# Patient Record
Sex: Male | Born: 1980 | Race: White | Hispanic: No | Marital: Married | Smoking: Never smoker
Health system: Southern US, Community
[De-identification: ages and names within clinical notes are randomized; demographics above are authoritative.]

## PROBLEM LIST (undated history)

## (undated) DIAGNOSIS — R030 Elevated blood-pressure reading, without diagnosis of hypertension: Secondary | ICD-10-CM

## (undated) DIAGNOSIS — E7800 Pure hypercholesterolemia, unspecified: Secondary | ICD-10-CM

## (undated) HISTORY — DX: Elevated blood-pressure reading, without diagnosis of hypertension: R03.0

## (undated) HISTORY — PX: NEVUS EXCISION: SHX2090

## (undated) HISTORY — DX: Pure hypercholesterolemia, unspecified: E78.00

---
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
## 2012-06-18 ENCOUNTER — Ambulatory Visit (INDEPENDENT_AMBULATORY_CARE_PROVIDER_SITE_OTHER): Payer: Managed Care, Other (non HMO) | Admitting: Family Medicine

## 2012-06-18 VITALS — BP 139/73 | HR 71 | Temp 97.9°F | Resp 16 | Ht 72.0 in | Wt 206.0 lb

## 2012-06-18 DIAGNOSIS — L738 Other specified follicular disorders: Secondary | ICD-10-CM

## 2012-06-18 DIAGNOSIS — R635 Abnormal weight gain: Secondary | ICD-10-CM

## 2012-06-18 DIAGNOSIS — L853 Xerosis cutis: Secondary | ICD-10-CM

## 2012-06-18 DIAGNOSIS — Z0000 Encounter for general adult medical examination without abnormal findings: Secondary | ICD-10-CM

## 2012-06-18 LAB — COMPREHENSIVE METABOLIC PANEL
ALT: 34 U/L (ref 0–53)
AST: 22 U/L (ref 0–37)
Albumin: 4.9 g/dL (ref 3.5–5.2)
Alkaline Phosphatase: 57 U/L (ref 39–117)
BUN: 12 mg/dL (ref 6–23)
CO2: 27 mEq/L (ref 19–32)
Calcium: 10.3 mg/dL (ref 8.4–10.5)
Chloride: 100 mEq/L (ref 96–112)
Creat: 1.06 mg/dL (ref 0.50–1.35)
Glucose, Bld: 82 mg/dL (ref 70–99)
Potassium: 4.1 mEq/L (ref 3.5–5.3)
Sodium: 137 mEq/L (ref 135–145)
Total Bilirubin: 0.6 mg/dL (ref 0.3–1.2)
Total Protein: 8.3 g/dL (ref 6.0–8.3)

## 2012-06-18 LAB — POCT CBC
Granulocyte percent: 62 %G (ref 37–80)
HCT, POC: 51.3 % (ref 43.5–53.7)
Hemoglobin: 17.2 g/dL (ref 14.1–18.1)
Lymph, poc: 2.3 (ref 0.6–3.4)
MCH, POC: 29.9 pg (ref 27–31.2)
MCHC: 33.5 g/dL (ref 31.8–35.4)
MCV: 89.1 fL (ref 80–97)
MID (cbc): 0.4 (ref 0–0.9)
MPV: 8.7 fL (ref 0–99.8)
POC Granulocyte: 4.5 (ref 2–6.9)
POC LYMPH PERCENT: 31.9 %L (ref 10–50)
POC MID %: 6.1 %M (ref 0–12)
Platelet Count, POC: 333 10*3/uL (ref 142–424)
RBC: 5.76 M/uL (ref 4.69–6.13)
RDW, POC: 13.6 %
WBC: 7.2 10*3/uL (ref 4.6–10.2)

## 2012-06-18 LAB — LIPID PANEL
Cholesterol: 210 mg/dL — ABNORMAL HIGH (ref 0–200)
HDL: 49 mg/dL (ref >39)
LDL Cholesterol: 139 mg/dL — ABNORMAL HIGH (ref 0–99)
Total CHOL/HDL Ratio: 4.3 Ratio
Triglycerides: 112 mg/dL (ref <150)
VLDL: 22 mg/dL (ref 0–40)

## 2012-06-18 LAB — TSH: TSH: 2.457 u[IU]/mL (ref 0.350–4.500)

## 2012-06-18 NOTE — Progress Notes (Signed)
Subjective:    Patient ID: Joe Alexander, male    DOB: [birth year], 33 y.o.   MRN: [id number]  HPI Joe Alexander is a 32 y.o. male  Here for physical for insurance.  No recent primary care provider.  No medical problems, no meds. No specific concerns today.  Fasting since midnight last night.   Off work past few years - has 33yo and 33yo dtr's at home, 9yo and 10yo sons. 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  Planning on law school.   Wife works at [employer] - sonographer.   Nonsmoker. Less exercise since staying home with the kids.   Dry skin for years, weight gain of about 15 pounds past few years - attributes to less activity.   FH: father with cad, cvd, diabetes, stroke.  First heart sx's in late 35's or early 58's.  2 brothers with CAD/MI - 43yo (fatal), and other brother with MI at 36yo - both were smokers.  Colon cancer - father - twice, earliest in mid 11's.       Review of Systems  Constitutional: Positive for unexpected weight change (15 pounds past few years. ). Negative for chills.  Endocrine: Negative for cold intolerance and heat intolerance.  All other systems reviewed and are negative.       Objective:   Physical Exam  Vitals reviewed. Constitutional: He is oriented to person, place, and time. He appears well-developed and well-nourished.  HENT:  Head: Normocephalic and atraumatic.  Right Ear: External ear normal.  Left Ear: External ear normal.  Mouth/Throat: Oropharynx is clear and moist.  Eyes: Conjunctivae and EOM are normal. Pupils are equal, round, and reactive to light.  Neck: Normal range of motion. Neck supple. No thyromegaly present.  Cardiovascular: Normal rate, regular rhythm, normal heart sounds and intact distal pulses.   Pulmonary/Chest: Effort normal and breath sounds normal. No respiratory distress. He has no wheezes.  Abdominal: Soft. He exhibits no distension. There is no tenderness.  Hernia confirmed negative in the right inguinal area and confirmed negative in the left inguinal area.  Musculoskeletal: Normal range of motion. He exhibits no edema and no tenderness.  Lymphadenopathy:    He has no cervical adenopathy.  Neurological: He is alert and oriented to person, place, and time. He has normal reflexes.  Skin: Skin is warm and dry.  Psychiatric: He has a normal mood and affect. His behavior is normal.    Results for orders placed in visit on 06/18/12  POCT CBC      Result Value Range   WBC 7.2  4.6 - 10.2 K/uL   Lymph, poc 2.3  0.6 - 3.4   POC LYMPH PERCENT 31.9  10 - 50 %L   MID (cbc) 0.4  0 - 0.9   POC MID % 6.1  0 - 12 %M   POC Granulocyte 4.5  2 - 6.9   Granulocyte percent 62.0  37 - 80 %G   RBC 5.76  4.69 - 6.13 M/uL   Hemoglobin 17.2  14.1 - 18.1 g/dL   HCT, POC 21.3  08.6 - 53.7 %   MCV 89.1  80 - 97 fL   MCH, POC 29.9  27 - 31.2 pg   MCHC 33.5  31.8 - 35.4 g/dL   RDW, POC 57.8     Platelet Count, POC 333  142 - 424 K/uL   MPV 8.7  0 - 99.8 fL  Assessment & Plan:  Joe Alexander is a 32 y.o. male Routine general medical examination at a health care facility - Plan: POCT CBC, TSH, Comprehensive metabolic panel, Lipid panel. Anticipatory guidance. rtc precautions. Form partially completed until labs.   Weight gain - Plan: TSH, Comprehensive metabolic panel.  Discussed exercise as likely cause.   Dry skin - Plan: TSH.  Discussed hydrating soap, lotion as needed, moisturizing shampoo few dry flakes in scalp on exam).  Patient Instructions  Check into your prior doctor's records.  If more than 10 years since last tetanus - needs updating.  Your should receive a call or letter about your lab results within the next week to 10 days, and you can bring your form to be signed at that point.  We will check cholesterol, blood sugar and other tests discussed. Plan on discussing risks of heart disease over the next few years given your early  family history.    Keeping you healthy  Get these tests  Blood pressure- Have your blood pressure checked once a year by your healthcare provider.  Normal blood pressure is 120/80.  Weight- Have your body mass index (BMI) calculated to screen for obesity.  BMI is a measure of body fat based on height and weight. You can also calculate your own BMI at https://www.west-esparza.com/.  Cholesterol- Have your cholesterol checked regularly starting at age 4, sooner may be necessary if you have diabetes, high blood pressure, if a family member developed heart diseases at an early age or if you smoke.   Chlamydia, HIV, and other sexual transmitted disease- Get screened each year until the age of 75 then within three months of each new sexual partner.  Diabetes- Have your blood sugar checked regularly if you have high blood pressure, high cholesterol, a family history of diabetes or if you are overweight.  Get these vaccines  Flu shot- Every fall.  Tetanus shot- Every 10 years.  Menactra- Single dose; prevents meningitis.  Take these steps  Don't smoke- If you do smoke, ask your healthcare provider about quitting. For tips on how to quit, go to www.smokefree.gov or call 1-800-QUIT-NOW.  Be physically active- Exercise 5 days a week for at least 30 minutes.  If you are not already physically active start slow and gradually work up to 30 minutes of moderate physical activity.  Examples of moderate activity include walking briskly, mowing the yard, dancing, swimming bicycling, etc.  Eat a healthy diet- Eat a variety of healthy foods such as fruits, vegetables, low fat milk, low fat cheese, yogurt, lean meats, poultry, fish, beans, tofu, etc.  For more information on healthy eating, go to www.thenutritionsource.org  Drink alcohol in moderation- Limit alcohol intake two drinks or less a day.  Never drink and drive.  Dentist- Brush and floss teeth twice daily; visit your dentis twice a  year.  Depression-Your emotional health is as important as your physical health.  If you're feeling down, losing interest in things you normally enjoy please talk with your healthcare provider.  Gun Safety- If you keep a gun in your home, keep it unloaded and with the safety lock on.  Bullets should be stored separately.  Helmet use- Always wear a helmet when riding a motorcycle, bicycle, rollerblading or skateboarding.  Safe sex- If you may be exposed to a sexually transmitted infection, use a condom  Seat belts- Seat bels can save your life; always wear one.  Smoke/Carbon Monoxide detectors- These detectors need to be installed on the  appropriate level of your home.  Replace batteries at least once a year.  Skin Cancer- When out in the sun, cover up and use sunscreen SPF 15 or higher.  Violence- If anyone is threatening or hurting you, please tell your healthcare provider.

## 2012-06-18 NOTE — Patient Instructions (Signed)
Check into your prior doctor's records.  If more than 10 years since last tetanus - needs updating.  Your should receive a call or letter about your lab results within the next week to 10 days, and you can bring your form to be signed at that point.  We will check cholesterol, blood sugar and other tests discussed. Plan on discussing risks of heart disease over the next few years given your early family history.    Keeping you healthy  Get these tests  Blood pressure- Have your blood pressure checked once a year by your healthcare provider.  Normal blood pressure is 120/80.  Weight- Have your body mass index (BMI) calculated to screen for obesity.  BMI is a measure of body fat based on height and weight. You can also calculate your own BMI at https://www.west-esparza.com/.  Cholesterol- Have your cholesterol checked regularly starting at age 33, sooner may be necessary if you have diabetes, high blood pressure, if a family member developed heart diseases at an early age or if you smoke.   Chlamydia, HIV, and other sexual transmitted disease- Get screened each year until the age of 33 then within three months of each new sexual partner.  Diabetes- Have your blood sugar checked regularly if you have high blood pressure, high cholesterol, a family history of diabetes or if you are overweight.  Get these vaccines  Flu shot- Every fall.  Tetanus shot- Every 10 years.  Menactra- Single dose; prevents meningitis.  Take these steps  Don't smoke- If you do smoke, ask your healthcare provider about quitting. For tips on how to quit, go to www.smokefree.gov or call 1-800-QUIT-NOW.  Be physically active- Exercise 5 days a week for at least 30 minutes.  If you are not already physically active start slow and gradually work up to 30 minutes of moderate physical activity.  Examples of moderate activity include walking briskly, mowing the yard, dancing, swimming bicycling, etc.  Eat a healthy diet- Eat a  variety of healthy foods such as fruits, vegetables, low fat milk, low fat cheese, yogurt, lean meats, poultry, fish, beans, tofu, etc.  For more information on healthy eating, go to www.thenutritionsource.org  Drink alcohol in moderation- Limit alcohol intake two drinks or less a day.  Never drink and drive.  Dentist- Brush and floss teeth twice daily; visit your dentis twice a year.  Depression-Your emotional health is as important as your physical health.  If you're feeling down, losing interest in things you normally enjoy please talk with your healthcare provider.  Gun Safety- If you keep a gun in your home, keep it unloaded and with the safety lock on.  Bullets should be stored separately.  Helmet use- Always wear a helmet when riding a motorcycle, bicycle, rollerblading or skateboarding.  Safe sex- If you may be exposed to a sexually transmitted infection, use a condom  Seat belts- Seat bels can save your life; always wear one.  Smoke/Carbon Monoxide detectors- These detectors need to be installed on the appropriate level of your home.  Replace batteries at least once a year.  Skin Cancer- When out in the sun, cover up and use sunscreen SPF 15 or higher.  Violence- If anyone is threatening or hurting you, please tell your healthcare provider.

## 2012-06-23 ENCOUNTER — Telehealth: Payer: Self-pay

## 2012-06-23 NOTE — Telephone Encounter (Signed)
Spoke with patient he will bring in another form for physical

## 2012-07-21 ENCOUNTER — Telehealth: Payer: Self-pay

## 2012-07-21 NOTE — Telephone Encounter (Signed)
Patient and his wife were here last week and had forms that had to be filled out for insurance purposes, he just wanted to make sure those forms had been faxed please call patient at [phone]

## 2012-07-21 NOTE — Telephone Encounter (Signed)
Form located completed and faxed. Pt advised.

## 2012-11-13 ENCOUNTER — Telehealth: Payer: Self-pay | Admitting: *Deleted

## 2012-11-13 NOTE — Telephone Encounter (Signed)
Left message for pt- need him to rtn to office for a vision test to complete PE exam form. At TL desk.

## 2012-12-27 ENCOUNTER — Ambulatory Visit (INDEPENDENT_AMBULATORY_CARE_PROVIDER_SITE_OTHER): Payer: PRIVATE HEALTH INSURANCE | Admitting: Physician Assistant

## 2012-12-27 VITALS — BP 122/84 | HR 86 | Temp 98.5°F | Resp 16 | Ht 72.0 in | Wt 205.2 lb

## 2012-12-27 DIAGNOSIS — Z23 Encounter for immunization: Secondary | ICD-10-CM

## 2012-12-27 NOTE — Patient Instructions (Signed)
Return in 4 weeks for Hep B vaccine #2, then in March 2015 for Hep B vaccine dose #3.

## 2012-12-27 NOTE — Progress Notes (Signed)
  Subjective:    Patient ID: [name], male    DOB: Nov 16, 1980, 32 y.o.   MRN: [id number]  HPI This 32 y.o. male presents for immunizations.  He was advised by the student health center that he needs a Tdap and the Hep B series to continue his studies at [employer].  He is the father of 4 boys.  His father died of an MI at age 45, and an older brother died of an MI at age 36.  He does not have a PCP locally, and hasn't seen his PCP in [place] in years. He did have a CPE here in February.  Medications, allergies, past medical history, surgical history, family history, social history and problem list reviewed.   Review of Systems     Objective:   Physical Exam BP 122/84  Pulse 86  Temp(Src) 98.5 F (36.9 C) (Oral)  Resp 16  Ht 6' (1.829 m)  Wt 205 lb 3.2 oz (93.078 kg)  BMI 27.82 kg/m2  SpO2 100% A&O x 3.       Assessment & Plan:  Need for Tdap vaccination - Plan: Tdap vaccine greater than or equal to 7yo IM  Need for hepatitis B vaccination - Plan: Hepatitis B vaccine adult IM  Fernande Bras, PA-C Physician Assistant-Certified [facility]

## 2013-07-20 ENCOUNTER — Ambulatory Visit (INDEPENDENT_AMBULATORY_CARE_PROVIDER_SITE_OTHER): Payer: PRIVATE HEALTH INSURANCE | Admitting: General Surgery

## 2013-07-20 ENCOUNTER — Encounter (INDEPENDENT_AMBULATORY_CARE_PROVIDER_SITE_OTHER): Payer: Self-pay | Admitting: General Surgery

## 2013-07-20 ENCOUNTER — Encounter (INDEPENDENT_AMBULATORY_CARE_PROVIDER_SITE_OTHER): Payer: Self-pay

## 2013-07-20 VITALS — BP 126/80 | HR 78 | Temp 97.8°F | Resp 14 | Ht 72.0 in | Wt 212.2 lb

## 2013-07-20 DIAGNOSIS — L738 Other specified follicular disorders: Secondary | ICD-10-CM

## 2013-07-20 DIAGNOSIS — L739 Follicular disorder, unspecified: Secondary | ICD-10-CM

## 2013-07-20 DIAGNOSIS — L678 Other hair color and hair shaft abnormalities: Secondary | ICD-10-CM

## 2013-07-20 MED ORDER — CEPHALEXIN 250 MG PO CAPS: 250.0000 mg | ORAL_CAPSULE | Freq: Four times a day (QID) | ORAL | Status: DC

## 2013-07-20 NOTE — Progress Notes (Signed)
Subjective:     Patient ID: Joe Alexander, male   DOB: 07/26/1980, 33 y.o.   MRN: [id number]  HPI The patient comes in with a raised and ulcerated growth on inner aspect of his left thigh. There is no surrounding lymphadenopathy. The ulceration came from what the patient to the operative portion of the skin off of it in hopes that it would drain the [facility] an abscess. There was only a small amount of drainage at that time.  Review of Systems No fevers or chills, no nausea vomiting. No night sweats.    Objective:   Physical Exam A 1-1/2-2 cm nodule on the inner aspect of his left thigh. There is no surrounding erythema. It is not very firm. Does not appear to be a cancer. The ulcerated area in the center has a small eschar on it. It was covered with a triple antibiotic ointment and a Band-Aid. I believe that this is just a folliculitis in this area however not 100% sure.    Assessment:     Likely burned out folliculitis on the inner aspect of his left side with ulceration in the center. However     Plan:      there is still off chance this could be more significant growth. I do not really suspect that we would like to see the patient again in the future.I will start the patient on Keflex 250 mg by mouth 4 times a day for 5 days. I will see the patient again in approximately one month just to make sure that this is improving and in no further treatment is necessary.

## 2013-08-18 ENCOUNTER — Encounter (INDEPENDENT_AMBULATORY_CARE_PROVIDER_SITE_OTHER): Payer: PRIVATE HEALTH INSURANCE | Admitting: General Surgery

## 2014-07-27 ENCOUNTER — Ambulatory Visit (INDEPENDENT_AMBULATORY_CARE_PROVIDER_SITE_OTHER): Payer: 59 | Admitting: Family Medicine

## 2014-07-27 ENCOUNTER — Encounter: Payer: Self-pay | Admitting: Family Medicine

## 2014-07-27 VITALS — BP 108/80 | HR 92 | Temp 98.2°F | Ht 72.0 in | Wt 219.4 lb

## 2014-07-27 DIAGNOSIS — J302 Other seasonal allergic rhinitis: Secondary | ICD-10-CM

## 2014-07-27 DIAGNOSIS — Z7189 Other specified counseling: Secondary | ICD-10-CM

## 2014-07-27 DIAGNOSIS — J069 Acute upper respiratory infection, unspecified: Secondary | ICD-10-CM

## 2014-07-27 DIAGNOSIS — Z7689 Persons encountering health services in other specified circumstances: Secondary | ICD-10-CM

## 2014-07-27 MED ORDER — PREDNISONE 20 MG PO TABS: 40.0000 mg | ORAL_TABLET | Freq: Every day | ORAL | Status: DC

## 2014-07-27 MED ORDER — BENZONATATE 100 MG PO CAPS: 100.0000 mg | ORAL_CAPSULE | Freq: Two times a day (BID) | ORAL | Status: DC | PRN

## 2014-07-27 NOTE — Patient Instructions (Signed)
BEFORE YOU LEAVE: -schedule a physical in 3 months - come fasting but drink plenty of water  Take the course of prednisone and the cough medication as needed. Please follow up if cough persists in 2 weeks or sooner if worsening.  We recommend the following healthy lifestyle measures: - eat a healthy diet consisting of lots of vegetables, fruits, beans, nuts, seeds, healthy meats such as white chicken and fish and whole grains.  - avoid fried foods, fast food, processed foods, sodas, red meet and other fattening foods.  - get a least 150 minutes of aerobic exercise per week.

## 2014-07-27 NOTE — Progress Notes (Signed)
Pre visit review using our clinic review tool, if applicable. No additional management support is needed unless otherwise documented below in the visit note. 

## 2014-07-27 NOTE — Progress Notes (Signed)
HPI:  Joe Alexander is here to establish care.   Has the following chronic problems that require follow up and concerns today:  URI/cough: -started about 4 weeks ago -initially had flu fever, runny nose, sore throat, cough, PND - everything else cleared up but cough has lingered -hx of "viral bronchitis" -denies SOB, wheezing, fevers, hemoptysis -has four kids 54 to 35 years old, some of the kids have had a bug lately -hx of seasonal allergies - takes claritin occasionally for this  ROS negative for unless reported above: fevers, unintentional weight loss, hearing or vision loss, chest pain, palpitations, struggling to breath, hemoptysis, melena, hematochezia, hematuria, falls, loc, si, thoughts of self harm  History reviewed. No pertinent past medical history.  Past Surgical History  Procedure Laterality Date  . Appendectomy      Family History  Problem Relation Age of Onset  . Heart attack Father 5955    deceased  . Stroke Father   . Diabetes Father   . Colon cancer Father 2550  . Heart attack Brother     deceased at age 4340s    History   Social History  . Marital Status: Married    Spouse Name: N/A  . Number of Children: N/A  . Years of Education: N/A   Social History Main Topics  . Smoking status: Never Smoker   . Smokeless tobacco: Not on file  . Alcohol Use: No  . Drug Use: No  . Sexual Activity: Not on file   Other Topics Concern  . 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  benzonatate (TESSALON) 100 MG capsule, Take 1 capsule (100 mg total) by mouth 2 (two) times daily as needed for cough., Disp: 20 capsule, Rfl: 0 .  predniSONE (DELTASONE) 20 MG tablet, Take 2  tablets (40 mg total) by mouth daily with breakfast., Disp: 8 tablet, Rfl: 0  EXAM:  Filed Vitals:   07/27/14 1604  BP: 108/80  Pulse: 92  Temp: 98.2 F (36.8 C)    Body mass index is 29.75 kg/(m^2).  GENERAL: vitals reviewed and listed above, alert, oriented, appears well hydrated and in no acute distress  HEENT: atraumatic, conjunttiva clear, no obvious abnormalities on inspection of external nose and ears  NECK: no obvious masses on inspection  LUNGS: clear to auscultation bilaterally, no wheezes, rales or rhonchi, good air movement  CV: HRRR, no peripheral edema  MS: moves all extremities without noticeable abnormality  PSYCH: pleasant and cooperative, no obvious depression or anxiety  ASSESSMENT AND PLAN:  Discussed the following assessment and plan:  Encounter to establish care  Seasonal allergies - Plan: benzonatate (TESSALON) 100 MG capsule, predniSONE (DELTASONE) 20 MG tablet  Acute upper respiratory infection  -We reviewed the PMH, PSH, FH, SH, Meds and Allergies. -We provided refills for any medications we will prescribe as needed. -We addressed current concerns per orders and patient instructions. -We have asked for records for pertinent exams, studies, vaccines and notes from previous providers. -We have advised patient to follow up per instructions below.  -Patient advised to return  or notify a doctor immediately if symptoms worsen or persist or new concerns arise.  Patient Instructions  BEFORE YOU LEAVE: -schedule a physical in 3 months - come fasting but drink plenty of water  Take the course of prednisone and the cough medication as needed. Please follow up if cough persists in 2 weeks or sooner if worsening.  We recommend the following healthy lifestyle measures: - eat a healthy diet consisting of lots of vegetables, fruits, beans, nuts, seeds, healthy meats such as white chicken and fish and whole grains.  - avoid fried foods, fast food,  processed foods, sodas, red meet and other fattening foods.  - get a least 150 minutes of aerobic exercise per week.        Kriste Basque R.

## 2014-08-09 ENCOUNTER — Telehealth: Payer: Self-pay | Admitting: Family Medicine

## 2014-08-09 NOTE — Telephone Encounter (Signed)
Patient's wife states patient needs a CPX but wants it with a male provider.  She stated patient would like it to be Dr. Caryl NeverBurchette and then mentioned the appointment was made with Dr. Selena BattenKim to establish but her husband really wants a male and wanted Dr. Caryl NeverBurchette.  I explained to her that Dr. Caryl NeverBurchette isn't accepting new patients and that if he wanted a male provider, than the new patient appointment should have been with a male provider accepting new patients, Dr. Durene CalHunter not Dr. Selena BattenKim.  Patient's wife would like to still see if Dr. Caryl NeverBurchette will do patient's CPX?  Please advise.

## 2014-08-09 NOTE — Telephone Encounter (Signed)
I am ok with another provider doing CPE - but if prefers a male provider for this moving forward,  probably should establish with Kandee Keenory or Dr. Durene CalHunter for his future care. Thanks.

## 2014-08-10 NOTE — Telephone Encounter (Signed)
Agree with Dr Selena BattenKim

## 2014-08-10 NOTE — Telephone Encounter (Signed)
Called and spoke to patient about the below messages.  I offered patient to establish with Dr. Durene CalHunter or Kandee Keenory. He said he will callback to discuss further.

## 2014-09-08 ENCOUNTER — Ambulatory Visit (INDEPENDENT_AMBULATORY_CARE_PROVIDER_SITE_OTHER): Payer: 59 | Admitting: Family Medicine

## 2014-09-08 ENCOUNTER — Encounter: Payer: Self-pay | Admitting: Family Medicine

## 2014-09-08 VITALS — BP 117/79 | HR 75 | Temp 98.0°F | Ht 72.0 in | Wt 218.0 lb

## 2014-09-08 DIAGNOSIS — R590 Localized enlarged lymph nodes: Secondary | ICD-10-CM | POA: Diagnosis not present

## 2014-09-08 NOTE — Progress Notes (Signed)
Pre visit review using our clinic review tool, if applicable. No additional management support is needed unless otherwise documented below in the visit note. 

## 2014-09-08 NOTE — Progress Notes (Signed)
   Subjective:    Patient ID: Floreen ComberJonathan L Yi, male    DOB: 12/20/1980, 34 y.o.   MRN: [id number]  HPI Here for 2 days of tenderness in the right anterior neck and into the right ear. No fever or cough. No gum pain or trouble chewing.    Review of Systems  Constitutional: Negative.   HENT: Positive for congestion and ear pain. Negative for ear discharge, postnasal drip and sinus pressure.   Eyes: Negative.   Respiratory: Negative.        Objective:   Physical Exam  Constitutional: He appears well-developed and well-nourished.  HENT:  Right Ear: External ear normal.  Left Ear: External ear normal.  Nose: Nose normal.  Mouth/Throat: Oropharynx is clear and moist.  Eyes: Conjunctivae are normal.  Neck:  Several tender right AC nodes   Pulmonary/Chest: Effort normal and breath sounds normal.          Assessment & Plan:  Reactive adenopathy, likely from a viral source. Use Ibuprofen prn, recheck prn

## 2014-09-27 ENCOUNTER — Emergency Department (HOSPITAL_COMMUNITY)
Admission: EM | Admit: 2014-09-27 | Discharge: 2014-09-28 | Disposition: A | Discharge status: Home / Self Care | Payer: 59 | Attending: Emergency Medicine | Admitting: Emergency Medicine

## 2014-09-27 ENCOUNTER — Encounter (HOSPITAL_COMMUNITY): Payer: Self-pay | Admitting: Emergency Medicine

## 2014-09-27 DIAGNOSIS — R0789 Other chest pain: Secondary | ICD-10-CM | POA: Insufficient documentation

## 2014-09-27 DIAGNOSIS — R079 Chest pain, unspecified: Secondary | ICD-10-CM | POA: Diagnosis present

## 2014-09-27 DIAGNOSIS — Z7982 Long term (current) use of aspirin: Secondary | ICD-10-CM | POA: Diagnosis not present

## 2014-09-27 NOTE — ED Notes (Signed)
Pt states he is having pain in his left chest that radiates down his left arm causing his fingers to feel tingly  Pt states the pain also radiates to his back

## 2014-09-28 LAB — BASIC METABOLIC PANEL
Anion gap: 9 (ref 5–15)
BUN: 11 mg/dL (ref 6–20)
CO2: 23 mmol/L (ref 22–32)
Calcium: 9 mg/dL (ref 8.9–10.3)
Chloride: 108 mmol/L (ref 101–111)
Creatinine, Ser: 1.1 mg/dL (ref 0.61–1.24)
GFR calc Af Amer: >60 mL/min (ref >60)
GFR calc non Af Amer: >60 mL/min (ref >60)
Glucose, Bld: 114 mg/dL — ABNORMAL HIGH (ref 65–99)
Potassium: 3.9 mmol/L (ref 3.5–5.1)
Sodium: 140 mmol/L (ref 135–145)

## 2014-09-28 LAB — CBC
HCT: 46.1 % (ref 39.0–52.0)
Hemoglobin: 16.1 g/dL (ref 13.0–17.0)
MCH: 30.3 pg (ref 26.0–34.0)
MCHC: 34.9 g/dL (ref 30.0–36.0)
MCV: 86.8 fL (ref 78.0–100.0)
Platelets: 297 10*3/uL (ref 150–400)
RBC: 5.31 MIL/uL (ref 4.22–5.81)
RDW: 12.8 % (ref 11.5–15.5)
WBC: 10.6 10*3/uL — ABNORMAL HIGH (ref 4.0–10.5)

## 2014-09-28 LAB — I-STAT TROPONIN, ED: Troponin i, poc: 0 ng/mL (ref 0.00–0.08)

## 2014-09-28 LAB — TROPONIN I: Troponin I: <0.03 ng/mL (ref <0.031)

## 2014-09-28 MED ORDER — ASPIRIN 325 MG PO TABS: 325.0000 mg | ORAL_TABLET | Freq: Every day | ORAL | Status: DC

## 2014-09-28 MED ORDER — ASPIRIN 81 MG PO CHEW
324.0000 mg | CHEWABLE_TABLET | Freq: Once | ORAL | Status: AC
  Administered 2014-09-28: 324 mg via ORAL
  Filled 2014-09-28: qty 4

## 2014-09-28 MED ORDER — IBUPROFEN 800 MG PO TABS: 800.0000 mg | ORAL_TABLET | Freq: Three times a day (TID) | ORAL | Status: DC | PRN

## 2014-09-28 MED ORDER — IBUPROFEN 800 MG PO TABS
800.0000 mg | ORAL_TABLET | Freq: Once | ORAL | Status: AC
  Administered 2014-09-28: 800 mg via ORAL
  Filled 2014-09-28: qty 1

## 2014-09-28 NOTE — ED Provider Notes (Addendum)
CSN: [id number]     Arrival date & time 09/27/14  2310 History   First MD Initiated Contact with Patient 09/28/14 0254     Chief Complaint  Patient presents with  . Chest Pain     (Consider location/radiation/quality/duration/timing/severity/associated sxs/prior Treatment) HPI  this is a 34 year old male with a family history of early coronary artery disease. He is here with a 2 day history of pain in his chest. He describes the pain as a sharp pain in the left pectoral region that radiates through to his back. It is also associated with a pain that goes down his left arm. There is sometimes associated tingling distally. There is no exertional exacerbation of the pain. It is been fairly constant with some waxing and waning in severity. There is been no associated shortness of breath, diaphoresis or nausea. He has been doing some heavier than usual yard work recently. The pain is not worse with movement of the neck but is somewhat worse with movement of the left shoulder. The pain is mild to moderate at its worst.   History reviewed. No pertinent past medical history. Past Surgical History  Procedure Laterality Date  . Appendectomy     Family History  Problem Relation Age of Onset  . Heart attack Father 13    deceased  . Stroke Father   . Diabetes Father   . Colon cancer Father 43  . Heart attack Brother     deceased at age 54s  . Heart attack Brother    History  Substance Use Topics  . Smoking status: Never Smoker   . Smokeless tobacco: Not on file  . Alcohol Use: No    Review of Systems  All other systems reviewed and are negative.   Allergies  Review of patient's allergies indicates no known allergies.  Home Medications   Prior to Admission medications   Medication Sig Start Date End Date Taking? Authorizing Provider  aspirin 81 MG tablet Take 324 mg by mouth every 6 (six) hours.   Yes Historical Provider, MD  benzonatate (TESSALON) 100 MG capsule Take 1 capsule (100  mg total) by mouth 2 (two) times daily as needed for cough. Patient not taking: Reported on 09/08/2014 07/27/14   Terressa Koyanagi, DO  predniSONE (DELTASONE) 20 MG tablet Take 2 tablets (40 mg total) by mouth daily with breakfast. Patient not taking: Reported on 09/08/2014 07/27/14   Terressa Koyanagi, DO   BP 137/100 mmHg  Pulse 70  Temp(Src) 98.2 F (36.8 C) (Oral)  Resp 19  SpO2 98%   Physical Exam General: Well-developed, well-nourished male in no acute distress; appearance consistent with age of record HENT: normocephalic; atraumatic Eyes: pupils equal, round and reactive to light; extraocular muscles intact Neck: supple Heart: regular rate and rhythm; no murmurs, rubs or gallops Lungs: clear to auscultation bilaterally Chest: Left upper chest wall tenderness Abdomen: soft; nondistended; nontender; no masses or hepatosplenomegaly; bowel sounds present Extremities: No deformity; full range of motion; pulses normal; exacerbation of pain with passive range of motion of the left shoulder Neurologic: Awake, alert and oriented; motor function intact in all extremities and symmetric; no facial droop Skin: Warm and dry Psychiatric: Normal mood and affect    ED Course  Procedures (including critical care time)   MDM   Nursing notes and vitals signs, including pulse oximetry, reviewed.  Summary of this visit's results, reviewed by myself:  Labs:  Results for orders placed or performed during the hospital encounter of 09/27/14 (  from the past 24 hour(s))  CBC     Status: Abnormal   Collection Time: 09/28/14 12:01 AM  Result Value Ref Range   WBC 10.6 (H) 4.0 - 10.5 K/uL   RBC 5.31 4.22 - 5.81 MIL/uL   Hemoglobin 16.1 13.0 - 17.0 g/dL   HCT 16.146.1 09.639.0 - 04.552.0 %   MCV 86.8 78.0 - 100.0 fL   MCH 30.3 26.0 - 34.0 pg   MCHC 34.9 30.0 - 36.0 g/dL   RDW 40.912.8 81.111.5 - 91.415.5 %   Platelets 297 150 - 400 K/uL  Basic metabolic panel     Status: Abnormal   Collection Time: 09/28/14 12:01 AM  Result  Value Ref Range   Sodium 140 135 - 145 mmol/L   Potassium 3.9 3.5 - 5.1 mmol/L   Chloride 108 101 - 111 mmol/L   CO2 23 22 - 32 mmol/L   Glucose, Bld 114 (H) 65 - 99 mg/dL   BUN 11 6 - 20 mg/dL   Creatinine, Ser 7.821.10 0.61 - 1.24 mg/dL   Calcium 9.0 8.9 - 95.610.3 mg/dL   GFR calc non Af Amer >60 >60 mL/min   GFR calc Af Amer >60 >60 mL/min   Anion gap 9 5 - 15  I-stat troponin, ED  (not at [facility], [facility])     Status: None   Collection Time: 09/28/14 12:07 AM  Result Value Ref Range   Troponin i, poc 0.00 0.00 - 0.08 ng/mL   Comment 3          Troponin I     Status: None   Collection Time: 09/28/14  3:16 AM  Result Value Ref Range   Troponin I <0.03 <0.031 ng/mL   4:29 AM Patient states pain is worse after having his shoulder manipulated during exam. It continues to be worse with movement. This likely represents musculoskeletal pain and we will treat with an anti-inflammatory. He will contact his PCP, Dr. Selena BattenKim, later this morning.   Paula LibraJohn Jaylaa Gallion, MD 09/28/14 [id number]  Paula LibraJohn Mattison Golay, MD 09/28/14 0430

## 2014-09-28 NOTE — ED Notes (Signed)
Patient stated he does not want an IV. He just wanted to check out his chest pain.

## 2014-09-28 NOTE — ED Notes (Signed)
Patient says the pain started about 7pm. Patient stated he notice some chest pain this morning and he did not pay attention to if it started yesterday. Patient states that his left elbow and felt tingling in his fingers.

## 2014-09-28 NOTE — ED Notes (Signed)
Pt called out to the NS desk and stated that he had to leave b/c he had been here for three hours.  Writer relayed the message to RN.

## 2014-09-28 NOTE — ED Notes (Signed)
Patient also states that he is having pain in back of left shoulder.

## 2014-11-18 ENCOUNTER — Telehealth: Payer: Self-pay | Admitting: Family Medicine

## 2014-11-18 DIAGNOSIS — R079 Chest pain, unspecified: Secondary | ICD-10-CM

## 2014-11-18 DIAGNOSIS — IMO0001 Reserved for inherently not codable concepts without codable children: Secondary | ICD-10-CM

## 2014-11-18 DIAGNOSIS — Z8249 Family history of ischemic heart disease and other diseases of the circulatory system: Secondary | ICD-10-CM

## 2014-11-18 DIAGNOSIS — R030 Elevated blood-pressure reading, without diagnosis of hypertension: Secondary | ICD-10-CM

## 2014-11-18 NOTE — Telephone Encounter (Signed)
Wife call to say that she would like her husband to see a Cardiologist because of his family history. His brother had a heart a attack and pass at age 35 and another brother had a heart attack in his early 5. He went to the ER on 09/27/14 for chest pains and elevated bp

## 2014-11-19 NOTE — Telephone Encounter (Signed)
I left a detailed message at the pts cell number the referral was placed and someone will call him with appt info.

## 2014-11-19 NOTE — Telephone Encounter (Signed)
Referral placed.

## 2014-12-03 ENCOUNTER — Encounter: Payer: Self-pay | Admitting: Cardiovascular Disease

## 2014-12-03 ENCOUNTER — Ambulatory Visit (INDEPENDENT_AMBULATORY_CARE_PROVIDER_SITE_OTHER): Payer: 59 | Admitting: Cardiovascular Disease

## 2014-12-03 VITALS — BP 130/88 | HR 74 | Ht 72.0 in | Wt 208.4 lb

## 2014-12-03 DIAGNOSIS — Z823 Family history of stroke: Secondary | ICD-10-CM | POA: Diagnosis not present

## 2014-12-03 DIAGNOSIS — Z13220 Encounter for screening for lipoid disorders: Secondary | ICD-10-CM | POA: Diagnosis not present

## 2014-12-03 DIAGNOSIS — R079 Chest pain, unspecified: Secondary | ICD-10-CM | POA: Insufficient documentation

## 2014-12-03 DIAGNOSIS — IMO0001 Reserved for inherently not codable concepts without codable children: Secondary | ICD-10-CM

## 2014-12-03 DIAGNOSIS — Z8249 Family history of ischemic heart disease and other diseases of the circulatory system: Secondary | ICD-10-CM

## 2014-12-03 MED ORDER — ASPIRIN EC 81 MG PO TBEC: 81.0000 mg | DELAYED_RELEASE_TABLET | Freq: Every day | ORAL | Status: DC

## 2014-12-03 MED ORDER — HYDROCHLOROTHIAZIDE 12.5 MG PO CAPS: 12.5000 mg | ORAL_CAPSULE | Freq: Every day | ORAL | Status: DC

## 2014-12-03 NOTE — Progress Notes (Signed)
Cardiology Office Note   Date:  12/03/2014   ID:  Joe Alexander, DOB 08/28/1980, MRN [id number]  PCP:  Terressa Koyanagi., DO  Cardiologist:   Madilyn Hook, MD   Chief Complaint  Patient presents with  . New Evaluation    no active chest pain now, fm hx of heart problems. has had some increased BP readings recently      History of Present Illness: Joe Alexander is a 34 y.o. male with HTN who presents for and evaluation of chest pain and family history of heart disease.  Joe Alexander had an episode of chest pain 2 weeks ago. One evening he noted heaviness in his chest and pain radiating to his back and left arm. There is no associated shortness of breath, nausea, vomiting, diaphoresis or palpitations. He was evaluated in the ED and had 2 sets of negative cardiac enzymes. His EKG did not reveal any ischemic changes. Thyroid function was within normal limits. It was thought to be due to musculoskeletal strain as he had been recently working in his chart.  The pain persisted for over a week.  It was constant and not exacerbated by exertion.  He notes that at the time his BP was elevated to 150/100 when he checked at home, though it has been lower recently.   Joe Alexander has a family history of premature coronary artery disease. He has 2 brothers who had MIs.  One brother had an MI at 27 and is deceased.  The other had an MI at 55 and is living. His mother has a history of a thoracic aortic aneurysm, abdominal aortic aneurysm, and a bicuspid aortic valve. His father had a heart attack at a young age as well.  He presents today to establish care with a cardiologist in evaluate his chest pain.  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 He does not get much exercise.   Past Medical History  Diagnosis Date  . Hypertension     Past Surgical History  Procedure Laterality Date  . Appendectomy       No current outpatient prescriptions on file.   No current  facility-administered medications for this visit.    Allergies:   Review of patient's allergies indicates no known allergies.    Social History:  The patient  reports that he has never smoked. He does not have any smokeless tobacco history on file. He reports that he does not drink alcohol or use illicit drugs.   Family History:  The patient's family history includes Anuerysm in his mother; Colon cancer (age of onset: 45) in his father; Diabetes in his father; Heart attack in his brother and brother; Heart attack (age of onset: 56) in his father; Hypertension in his mother; Stroke in his father.    ROS:  Please see the history of present illness.   Otherwise, review of systems are positive for face fasciculations.   All other systems are reviewed and negative.    PHYSICAL EXAM: VS:  BP 130/88 mmHg  Pulse 74  Ht 6' (1.829 m)  Wt 94.53 kg (208 lb 6.4 oz)  BMI 28.26 kg/m2 , BMI Body mass index is 28.26 kg/(m^2). GENERAL:  Well appearing HEENT:  Pupils equal round and reactive, fundi not visualized, oral mucosa unremarkable NECK:  No jugular venous distention, waveform within normal limits, carotid upstroke brisk and symmetric, no bruits, no thyromegaly LYMPHATICS:  No cervical adenopathy LUNGS:  Clear to auscultation bilaterally HEART:  RRR.  PMI not displaced or sustained,S1 and S2 within normal limits, no S3, no S4, no clicks, no rubs, no murmurs ABD:  Flat, positive bowel sounds normal in frequency in pitch, no bruits, no rebound, no guarding, no midline pulsatile mass, no hepatomegaly, no splenomegaly EXT:  2 plus pulses throughout, no edema, no cyanosis no clubbing SKIN:  No rashes no nodules NEURO:  Cranial nerves II through XII grossly intact, motor grossly intact throughout PSYCH:  Cognitively intact, oriented to person place and time    EKG:  EKG is ordered today. The ekg ordered today demonstrates sinus rhythm 74 bpm.     Recent Labs: 09/28/2014: BUN 11; Creatinine, Ser  1.10; Hemoglobin 16.1; Platelets 297; Potassium 3.9; Sodium 140    Lipid Panel    Component Value Date/Time   CHOL 210* 06/18/2012 1328   TRIG 112 06/18/2012 1328   HDL 49 06/18/2012 1328   CHOLHDL 4.3 06/18/2012 1328   VLDL 22 06/18/2012 1328   LDLCALC 139* 06/18/2012 1328      Wt Readings from Last 3 Encounters:  12/03/14 94.53 kg (208 lb 6.4 oz)  09/08/14 98.884 kg (218 lb)  07/27/14 99.519 kg (219 lb 6.4 oz)      ASSESSMENT AND PLAN:  # Chest pain: Joe Alexander's chest pain is quite atypical.  It was constant for several days.  However, given his family history of premature CAD, will obtain a treadmill exercise test.  # Family history of aortic aneurysm: Joe Alexander's mom had thoracic and aortic aneurysms.  He thinks she may have had a bicuspid valve.  His exam is unremarkable and he has no signs of heart failure.  Will obtain a baseline echo for thoracic aorta size and andy structural abnormalities.  Will wait on abdominal aorta screening.  # Elevated BP: Joe Alexander's BP was high at home and has been elevated at least twice in our system.  We will start HCTZ 12.5mg  daily.  # CV disease prevention: Will plan for aggressive risk factor modification given his age.  We discussed the need to increase exercise to 30-40 minutes most days of the week.  ASCVD risk calculator is not really applicable to him, given his family history.  Will obtain a lipid panel.  Given the risk benefit profile, recommend starting aspirin 81mg  daily.  Will address lipids with lifestyle modifications for now unless they are significantly elevated.  Current medicines are reviewed at length with the patient today.  The patient does not have concerns regarding medicines.  The following changes have been made:  Start aspirin 81mg  daily and HCTZ 12.5mg  daily  Labs/ tests ordered today include: treadmill exercise stress test  No orders of the defined types were placed in this encounter.     Disposition:    FU with Dr. Elmarie Shiley C. Napanoch in 3 months   Signed, Madilyn Hook, MD  12/03/2014 4:23 PM    [facility]

## 2014-12-03 NOTE — Patient Instructions (Addendum)
START TAKING 81 MG ASPIRIN ONE TABLET DAILY- HEART PROTECTION  Your physician has requested that you have an exercise tolerance test can be schedule at [facility] as well. For further information please visit [email or web address] Please also follow instruction sheet, as given.  Your physician has requested that you have an echocardiogram WILL BE SCHEDULE [address]. Echocardiography is a painless test that uses sound waves to create images of your heart. It provides your doctor with information about the size and shape of your heart and how well your heart's chambers and valves are working. This procedure takes approximately one hour. There are no restrictions for this procedure.  We will contact you with results.  START HCT 12.5 MG ONE CAPSULE DAILY  LABS LIPID , LIVER PANEL -   [address]---- [place].  Your physician wants you to follow-up in 1 month WITH DR [place].

## 2015-01-10 ENCOUNTER — Ambulatory Visit (INDEPENDENT_AMBULATORY_CARE_PROVIDER_SITE_OTHER): Payer: 59

## 2015-01-10 ENCOUNTER — Encounter: Payer: 59 | Admitting: Physician Assistant

## 2015-01-10 ENCOUNTER — Ambulatory Visit (HOSPITAL_COMMUNITY): Payer: 59 | Attending: Cardiovascular Disease

## 2015-01-10 ENCOUNTER — Other Ambulatory Visit: Payer: Self-pay

## 2015-01-10 DIAGNOSIS — Z823 Family history of stroke: Secondary | ICD-10-CM

## 2015-01-10 DIAGNOSIS — Z8249 Family history of ischemic heart disease and other diseases of the circulatory system: Secondary | ICD-10-CM

## 2015-01-10 DIAGNOSIS — I071 Rheumatic tricuspid insufficiency: Secondary | ICD-10-CM | POA: Insufficient documentation

## 2015-01-10 DIAGNOSIS — IMO0001 Reserved for inherently not codable concepts without codable children: Secondary | ICD-10-CM

## 2015-01-10 DIAGNOSIS — R079 Chest pain, unspecified: Secondary | ICD-10-CM

## 2015-01-10 DIAGNOSIS — I10 Essential (primary) hypertension: Secondary | ICD-10-CM | POA: Diagnosis not present

## 2015-01-10 LAB — EXERCISE TOLERANCE TEST
Estimated workload: 11.9 METS
Exercise duration (min): 10 min
Exercise duration (sec): 7 s
MPHR: 185 {beats}/min
Peak HR: 193 {beats}/min
Percent HR: 104 %
RPE: 15
Rest HR: 72 {beats}/min

## 2015-01-11 ENCOUNTER — Telehealth: Payer: Self-pay | Admitting: *Deleted

## 2015-01-11 NOTE — Telephone Encounter (Signed)
LEFT MESSAGE TO CALL BACK

## 2015-01-11 NOTE — Telephone Encounter (Signed)
-----   Message from Chilton Si, MD sent at 01/10/2015 10:31 PM EDT ----- Normal stress test.

## 2015-01-12 NOTE — Telephone Encounter (Signed)
-----   Message from Tiffany Octa, MD sent at 01/10/2015 10:31 PM EDT ----- Normal stress test. 

## 2015-01-12 NOTE — Telephone Encounter (Signed)
Spoke to patient. Result given . Verbalized understanding  

## 2015-01-14 ENCOUNTER — Telehealth: Payer: Self-pay | Admitting: Cardiovascular Disease

## 2015-01-14 DIAGNOSIS — Z8249 Family history of ischemic heart disease and other diseases of the circulatory system: Secondary | ICD-10-CM

## 2015-01-14 DIAGNOSIS — R079 Chest pain, unspecified: Secondary | ICD-10-CM

## 2015-01-14 DIAGNOSIS — Z13220 Encounter for screening for lipoid disorders: Secondary | ICD-10-CM

## 2015-01-14 DIAGNOSIS — Z823 Family history of stroke: Secondary | ICD-10-CM

## 2015-01-14 NOTE — Telephone Encounter (Signed)
Pt called in stating that his insurance does not cover [facility] lab. Those lab orders need to be sent to [facility]. Please call pt once this is done  Thanks

## 2015-01-14 NOTE — Telephone Encounter (Signed)
Labs reordered for [facility]. Pt aware.

## 2015-01-17 ENCOUNTER — Ambulatory Visit: Payer: 59 | Admitting: Cardiovascular Disease

## 2017-01-30 DIAGNOSIS — K6289 Other specified diseases of anus and rectum: Secondary | ICD-10-CM | POA: Diagnosis not present

## 2017-01-30 DIAGNOSIS — K602 Anal fissure, unspecified: Secondary | ICD-10-CM | POA: Diagnosis not present

## 2017-01-30 DIAGNOSIS — Z800 Family history of malignant neoplasm of digestive organs: Secondary | ICD-10-CM | POA: Diagnosis not present

## 2017-02-06 DIAGNOSIS — Z0001 Encounter for general adult medical examination with abnormal findings: Secondary | ICD-10-CM | POA: Diagnosis not present

## 2017-02-08 DIAGNOSIS — Z8249 Family history of ischemic heart disease and other diseases of the circulatory system: Secondary | ICD-10-CM | POA: Diagnosis not present

## 2017-02-08 DIAGNOSIS — Z800 Family history of malignant neoplasm of digestive organs: Secondary | ICD-10-CM | POA: Diagnosis not present

## 2017-02-08 DIAGNOSIS — Z0001 Encounter for general adult medical examination with abnormal findings: Secondary | ICD-10-CM | POA: Diagnosis not present

## 2017-02-14 ENCOUNTER — Telehealth: Payer: Self-pay | Admitting: Cardiovascular Disease

## 2017-02-14 NOTE — Telephone Encounter (Signed)
[facility] on 03/08/17 with Dr Duke Salviaandolph.  Records put with Dr Leonides Sakeandolph's schedule for 03/08/17. lp

## 2017-03-06 ENCOUNTER — Encounter: Payer: Self-pay | Admitting: *Deleted

## 2017-03-08 ENCOUNTER — Ambulatory Visit: Payer: Self-pay | Admitting: Cardiovascular Disease

## 2017-04-11 DIAGNOSIS — E7800 Pure hypercholesterolemia, unspecified: Secondary | ICD-10-CM | POA: Diagnosis not present

## 2017-05-16 NOTE — Progress Notes (Signed)
Cardiology Office Note   Date:  05/20/2017   ID:  Joe ComberJonathan L Alexander, DOB 12/17/1980, MRN [id number] Morning PCP:  Joe Alexander, Walter, MD  Cardiologist:   Joe Siiffany McIntyre, MD   Chief Complaint  Patient presents with  . Follow-up      History of Present Illness: Joe Alexander is a 37 y.o. male with significant family history of premature CAD and hyperlipidemia who presents for follow up.  He was initially seen 12/2014 for and evaluation of chest pain and family history of heart disease.  He has a family history of premature CAD, which a brother who had an MI at age 10141 and another at age 38.  He was referred for ETT that was negative for ischemia.  He has a family history of ascending aorta aneurysm and was referred for echo that revealed LVEF 65-70% with normal diastolic function and no evidence of aortic aneurysm.  Since his last appointment Mr. Joe Alexander has been doing well.  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  He he has not experienced any chest pain or shortness of breath.  He exercises regularly by doing pull-ups each hour.  He also lifts weights in his garage.  he does not do much cardio but has a treadmill and plans to start that soon.  He stopped taking hydrochlorothiazide.  He stopped drinking sodas last month and has lost 10 pounds.  Since his last appointment Mr. Joe Alexander's brother and niece both had MIs.  He has been working with Joe Alexander and was started on rosuvastatin due to his LDL particle sizes.  He is due to have repeat lipid testing this week.   History reviewed. No pertinent past medical history.  Past Surgical History:  Procedure Laterality Date  . APPENDECTOMY  1989     Current Outpatient Medications  Medication Sig Dispense Refill  . rosuvastatin (CRESTOR) 10 MG tablet Take 10 mg by mouth daily.    Marland Kitchen. aspirin EC 81 MG tablet Take 1 tablet (81 mg total) by mouth daily. 90 tablet 3   No current  facility-administered medications for this visit.     Allergies:   Patient has no known allergies.    Social History:  The patient  reports that  has never smoked. he has never used smokeless tobacco. He reports that he does not drink alcohol or use drugs.   Family History:  The patient's family history includes Anuerysm in his mother; Colon cancer (age of onset: 6250) in his father; Diabetes in his father; Heart attack in his brother and brother; Heart attack (age of onset: 3655) in his father; Hypertension in his mother; Stroke in his father.    ROS:  Please see the history of present illness.   Otherwise, review of systems are positive for face fasciculations.   All other systems are reviewed and negative.    PHYSICAL EXAM: VS:  BP 118/76   Pulse 78   Ht 6' (1.829 m)   Wt 208 lb (94.3 kg)   BMI 28.21 kg/m  , BMI Body mass index is 28.21 kg/m. GENERAL:  Well appearing HEENT: Pupils equal round and reactive, fundi not visualized, oral mucosa unremarkable NECK:  No jugular venous distention, waveform within normal limits, carotid upstroke brisk and symmetric, no bruits, no thyromegaly LUNGS:  Clear to auscultation bilaterally HEART:  RRR.  PMI not displaced or sustained,S1 and S2 within normal limits, no S3, no S4,  no clicks, no rubs, no murmurs ABD:  Flat, positive bowel sounds normal in frequency in pitch, no bruits, no rebound, no guarding, no midline pulsatile mass, no hepatomegaly, no splenomegaly EXT:  2 plus pulses throughout, no edema, no cyanosis no clubbing SKIN:  No rashes no nodules NEURO:  Cranial nerves II through XII grossly intact, motor grossly intact throughout PSYCH:  Cognitively intact, oriented to person place and time   EKG:  EKG is not ordered today. The ekg ordered 12/03/14 demonstrates sinus rhythm 74 bpm.    Echo 01/12/15: Study Conclusions  - Left ventricle: The cavity size was normal. Systolic function was   vigorous. The estimated ejection fraction was  in the range of 65%   to 70%. Wall motion was normal; there were no regional wall   motion abnormalities. Left ventricular diastolic function   parameters were normal. - Aortic valve: Trileaflet; normal thickness leaflets. There was no   regurgitation. - Aortic root: The aortic root was normal in size. - Mitral valve: Structurally normal valve. - Left atrium: The atrium was normal in size. - Right ventricle: The cavity size was normal. Wall thickness was   normal. Systolic function was normal. - Right atrium: The atrium was normal in size. - Tricuspid valve: There was trivial regurgitation. - Pulmonic valve: Structurally normal valve. There was no   regurgitation. - Pulmonary arteries: Systolic pressure was within the normal   range. - Inferior vena cava: The vessel was normal in size. - Pericardium, extracardiac: There was no pericardial effusion.  ETT 01/10/15: Normal   Recent Labs: No results found for requested labs within last 8760 hours.    Lipid Panel    Component Value Date/Time   CHOL 210 (H) 06/18/2012 1328   TRIG 112 06/18/2012 1328   HDL 49 06/18/2012 1328   CHOLHDL 4.3 06/18/2012 1328   VLDL 22 06/18/2012 1328   LDLCALC 139 (H) 06/18/2012 1328   02/08/17: Total cholesterol 191, HDL 40, LDL 126, glucose is 123 Creatinine 1.1, potassium 4.1 TSH 2.46  Wt Readings from Last 3 Encounters:  05/20/17 208 lb (94.3 kg)  12/03/14 208 lb 6.4 oz (94.5 kg)  09/08/14 218 lb (98.9 kg)      ASSESSMENT AND PLAN:  # Chest pain: Resolved.  ETT was negative for ischemia.  Given his family history he would be a good candidate for a coronary CT-A if he has chest pain in the future.  Low threshold for cardiac catheterization.  # Family history of aortic aneurysm: Joe Alexander mom had thoracic and aortic aneurysms.  None noted on his echo 12/2014.  # Elevated BP: Resolved with weight loss.  No HCTZ needed.   # CV disease prevention: Will plan for aggressive risk factor  modification given his age.  Agree with continuing aspirin and rosuvastatin.  We will get a copy of his lipids from Dr. Renne Crigler.   Current medicines are reviewed at length with the patient today.  The patient does not have concerns regarding medicines.  The following changes have been made: Stop HCTZ.   Labs/ tests ordered today include: No orders of the defined types were placed in this encounter.    Disposition:   FU with Dr. Elmarie Shiley C. Duke Salvia in 2 years.    Signed, Joe Si, MD  05/20/2017 9:20 AM    [facility]

## 2017-05-20 ENCOUNTER — Ambulatory Visit (INDEPENDENT_AMBULATORY_CARE_PROVIDER_SITE_OTHER): Payer: BLUE CROSS/BLUE SHIELD | Admitting: Cardiovascular Disease

## 2017-05-20 ENCOUNTER — Telehealth: Payer: Self-pay | Admitting: Cardiovascular Disease

## 2017-05-20 ENCOUNTER — Encounter: Payer: Self-pay | Admitting: Cardiovascular Disease

## 2017-05-20 ENCOUNTER — Encounter (INDEPENDENT_AMBULATORY_CARE_PROVIDER_SITE_OTHER): Payer: Self-pay

## 2017-05-20 VITALS — BP 118/76 | HR 78 | Ht 72.0 in | Wt 208.0 lb

## 2017-05-20 DIAGNOSIS — E7800 Pure hypercholesterolemia, unspecified: Secondary | ICD-10-CM | POA: Diagnosis not present

## 2017-05-20 DIAGNOSIS — Z8249 Family history of ischemic heart disease and other diseases of the circulatory system: Secondary | ICD-10-CM

## 2017-05-20 NOTE — Telephone Encounter (Signed)
Pt was here this morning.He thought Dr Duke Salviaandolph told him to stop taking one of his medicine,but he thought he saw it on his sheet to continue taking it.

## 2017-05-20 NOTE — Telephone Encounter (Signed)
Spoke with patient and advised yes HCTZ was d/c at office visit today

## 2017-05-20 NOTE — Telephone Encounter (Signed)
Follow up   Patient is following up about medication. Please call.

## 2017-05-20 NOTE — Patient Instructions (Addendum)
Medication Instructions:  STOP HYDROCHLOROTHIAZIDE   Labwork: NONE  Testing/Procedures: NONE  Follow-Up: Your physician wants you to follow-up in: 2 year ov You will receive a reminder letter in the mail two months in advance. If you don't receive a letter, please call our office to schedule the follow-up appointment.  If you need a refill on your cardiac medications before your next appointment, please call your pharmacy.

## 2017-06-28 DIAGNOSIS — L84 Corns and callosities: Secondary | ICD-10-CM | POA: Diagnosis not present

## 2017-06-28 DIAGNOSIS — L218 Other seborrheic dermatitis: Secondary | ICD-10-CM | POA: Diagnosis not present

## 2017-06-28 DIAGNOSIS — Z1283 Encounter for screening for malignant neoplasm of skin: Secondary | ICD-10-CM | POA: Diagnosis not present

## 2017-06-28 DIAGNOSIS — L308 Other specified dermatitis: Secondary | ICD-10-CM | POA: Diagnosis not present

## 2017-06-28 DIAGNOSIS — D485 Neoplasm of uncertain behavior of skin: Secondary | ICD-10-CM | POA: Diagnosis not present

## 2018-02-10 DIAGNOSIS — Z0001 Encounter for general adult medical examination with abnormal findings: Secondary | ICD-10-CM | POA: Diagnosis not present

## 2018-02-10 DIAGNOSIS — E7800 Pure hypercholesterolemia, unspecified: Secondary | ICD-10-CM | POA: Diagnosis not present

## 2018-02-14 DIAGNOSIS — Z0001 Encounter for general adult medical examination with abnormal findings: Secondary | ICD-10-CM | POA: Diagnosis not present

## 2018-02-20 ENCOUNTER — Encounter: Payer: Self-pay | Admitting: Internal Medicine

## 2018-03-28 ENCOUNTER — Ambulatory Visit: Payer: BLUE CROSS/BLUE SHIELD | Admitting: Internal Medicine

## 2018-03-28 ENCOUNTER — Encounter: Payer: Self-pay | Admitting: Internal Medicine

## 2018-03-28 VITALS — BP 118/82 | HR 86 | Ht 72.0 in | Wt 214.4 lb

## 2018-03-28 DIAGNOSIS — K648 Other hemorrhoids: Secondary | ICD-10-CM | POA: Diagnosis not present

## 2018-03-28 DIAGNOSIS — K594 Anal spasm: Secondary | ICD-10-CM | POA: Diagnosis not present

## 2018-03-28 DIAGNOSIS — Z800 Family history of malignant neoplasm of digestive organs: Secondary | ICD-10-CM | POA: Diagnosis not present

## 2018-03-28 MED ORDER — HYDROCORTISONE 2.5 % RE CREA: 1.0000 "application " | TOPICAL_CREAM | Freq: Every day | RECTAL | 1 refills | Status: DC

## 2018-03-28 MED ORDER — AMBULATORY NON FORMULARY MEDICATION: 2 refills | Status: DC

## 2018-03-28 NOTE — Progress Notes (Signed)
   Joe ComberJonathan L Alexander 37 y.o. 07/26/1980 [id number] Referred by: Joe Alexander, Walter, MD  Assessment & Plan:   Encounter Diagnoses  Name Primary?  . Prolapsed hemorrhoids Yes  . Anal spasm   . Family history of colon cancer in father dx 3150     Hemorrhoids with hydrocortisone cream x2 weeks, using that nightly.  Treat anal spasm with diltiazem 2%/lidocaine 5% cream twice daily at least.  Schedule a colonoscopy because of the family history of colon cancer.The risks and benefits as well as alternatives of endoscopic procedure(s) have been discussed and reviewed. All questions answered. The patient agrees to proceed. Further plans pending this treatment course and evaluation.  I appreciate the opportunity to care for this patient. 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  There is some minor discomfort but no sharp pain.  He had some bleeding about a year ago and some itching and discomfort.  Is using wet wipes and showers after defecation.  Sometimes he will use fiber supplementation and that seems to help a bit though most of his stool seem to be soft and there is not significant straining.  His father did have colon cancer diagnosed at age 38, had multiple colon polyp issues and was back and forth to the hospital.  He died at 3655. No Known Allergies Current Meds  Medication Sig  . rosuvastatin (CRESTOR) 10 MG tablet Take 10 mg by mouth daily.   Past Medical History:  Diagnosis Date  . Pure hypercholesterolemia    Past Surgical History:  Procedure Laterality Date  . APPENDECTOMY  1989  . 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   Review of Systems See HPI, all other review of systems are negative  Objective:   Physical Exam BP 118/82   Pulse 86   Ht 6' (1.829 m)   Wt 214 lb 6 oz (97.2 kg)   BMI 29.07 kg/m  NAD WDWN Eyes anicteric Lungs clear Cor NL abd soft NT no HSM/mass NL mood/affect Skin multiple nevi, freckles some scars from nevi resection on the back  Rectal  NL anoderm, no prolapse with Valsalva +++ spasm and tenderness  0.125% NTG -applied topical into the anal canal Anoscopy  small anoscope used - Gr 2 + int hemorrhoids all positions,  Data PCP note 02/10/2018 CBC normal February 10, 2018 CMET also normal same date

## 2018-03-28 NOTE — Patient Instructions (Addendum)
You have been scheduled for a colonoscopy. Please follow written instructions given to you at your visit today.  Please pick up your prep supplies at the pharmacy within the next 1-3 days. If you use inhalers (even only as needed), please bring them with you on the day of your procedure. Your physician has requested that you go to www.startemmi.com and enter the access code given to you at your visit today. This web site gives a general overview about your procedure. However, you should still follow specific instructions given to you by our office regarding your preparation for the procedure.  We have sent the following medications to [facility] for you to pick up at your convenience:

## 2018-04-22 ENCOUNTER — Encounter: Payer: Self-pay | Admitting: Internal Medicine

## 2018-05-02 ENCOUNTER — Telehealth: Payer: Self-pay | Admitting: Internal Medicine

## 2018-05-02 NOTE — Telephone Encounter (Signed)
Tried to call and tell him his prep is all over the counter: Miralax 238 g bottle, Gatoraide 64 oz. , Dulcolax 5 mg #4 pills. Phone just rang and rang. Will try again later.

## 2018-05-02 NOTE — Telephone Encounter (Signed)
Per Abundio I called his wife Altha Harm at [phone] and went over the instructions and prep kit information -all over the counter. She is going to come and pick up a new copy of the instructions as she is not sure where they are at home.

## 2018-05-06 ENCOUNTER — Encounter: Payer: Self-pay | Admitting: Internal Medicine

## 2018-05-06 ENCOUNTER — Ambulatory Visit (AMBULATORY_SURGERY_CENTER): Payer: BLUE CROSS/BLUE SHIELD | Admitting: Internal Medicine

## 2018-05-06 VITALS — BP 115/79 | HR 79 | Temp 97.1°F | Resp 14 | Ht 72.0 in | Wt 214.0 lb

## 2018-05-06 DIAGNOSIS — Z1211 Encounter for screening for malignant neoplasm of colon: Secondary | ICD-10-CM | POA: Diagnosis not present

## 2018-05-06 DIAGNOSIS — Z800 Family history of malignant neoplasm of digestive organs: Secondary | ICD-10-CM

## 2018-05-06 MED ORDER — SODIUM CHLORIDE 0.9 % IV SOLN: 500.0000 mL | Freq: Once | INTRAVENOUS | Status: AC

## 2018-05-06 NOTE — Patient Instructions (Addendum)
No polyps seen.  You have hemorrhoids as we know - please take the medication prescriibed and if that fails to help adequately return to see me.  Next routine colonoscopy or other screening test in 5 years - 2025.  I appreciate the opportunity to care for you. Iva Booparl E. Asaiah Hunnicutt, MD, FACG   YOU HAD AN ENDOSCOPIC PROCEDURE TODAY AT THE [facility]:   Refer to the procedure report that was given to you for any specific questions about what was found during the examination.  If the procedure report does not answer your questions, please call your gastroenterologist to clarify.  If you requested that your care partner not be given the details of your procedure findings, then the procedure report has been included in a sealed envelope for you to review at your convenience later.  YOU SHOULD EXPECT: Some feelings of bloating in the abdomen. Passage of more gas than usual.  Walking can help get rid of the air that was put into your GI tract during the procedure and reduce the bloating. If you had a lower endoscopy (such as a colonoscopy or flexible sigmoidoscopy) you may notice spotting of blood in your stool or on the toilet paper. If you underwent a bowel prep for your procedure, you may not have a normal bowel movement for a few days.  Please Note:  You might notice some irritation and congestion in your nose or some drainage.  This is from the oxygen used during your procedure.  There is no need for concern and it should clear up in a day or so.  SYMPTOMS TO REPORT IMMEDIATELY:   Following lower endoscopy (colonoscopy or flexible sigmoidoscopy):  Excessive amounts of blood in the stool  Significant tenderness or worsening of abdominal pains  Swelling of the abdomen that is new, acute  Fever of 100F or higher   Black, tarry-looking stools  For urgent or emergent issues, a gastroenterologist can be reached at any hour by calling [phone].   DIET:  We do recommend a  small meal at first, but then you may proceed to your regular diet.  Drink plenty of fluids but you should avoid alcoholic beverages for 24 hours.  ACTIVITY:  You should plan to take it easy for the rest of today and you should NOT DRIVE or use heavy machinery until tomorrow (because of the sedation medicines used during the test).    FOLLOW UP: Our staff will call the number listed on your records the next business day following your procedure to check on you and address any questions or concerns that you may have regarding the information given to you following your procedure. If we do not reach you, we will leave a message.  However, if you are feeling well and you are not experiencing any problems, there is no need to return our call.  We will assume that you have returned to your regular daily activities without incident.  If any biopsies were taken you will be contacted by phone or by letter within the next 1-3 weeks.  Please call us at [phone] if you have not heard about the biopsies in 3 weeks.    SIGNATURES/CONFIDENTIALITY: You and/or your care partner have signed paperwork which will be entered into your electronic medical record.  These signatures attest to the fact that that the information above on your After Visit Summary has been reviewed and is understood.  Full responsibility of the confidentiality of this discharge information lies  with you and/or your care-partner.

## 2018-05-06 NOTE — Progress Notes (Signed)
Pt's states no medical or surgical changes since previsit or office visit. 

## 2018-05-06 NOTE — Progress Notes (Signed)
Spontaneous respirations throughout. VSS. Resting comfortably. To PACU on room air. Report to  RN. 

## 2018-05-06 NOTE — Op Note (Signed)
[facility] Patient Name: Joe Alexander Procedure Date: 05/06/2018 2:48 PM MRN: [id number] Endoscopist: Joe Boop , MD Age: 39 Referring MD:  Date of Birth: [birth year] Gender: Male Account #: [id number] Procedure:                Colonoscopy Indications:              Screening in patient at increased risk: Colorectal                            cancer in father before age 41 Medicines:                Propofol per Anesthesia, Monitored Anesthesia Care Procedure:                Pre-Anesthesia Assessment:                           - Prior to the procedure, a History and Physical                            was performed, and patient medications and                            allergies were reviewed. The patient's tolerance of                            previous anesthesia was also reviewed. The risks                            and benefits of the procedure and the sedation                            options and risks were discussed with the patient.                            All questions were answered, and informed consent                            was obtained. Prior Anticoagulants: The patient has                            taken no previous anticoagulant or antiplatelet                            agents. ASA Grade Assessment: I - A normal, healthy                            patient. After reviewing the risks and benefits,                            the patient was deemed in satisfactory condition to                            undergo the procedure.  After obtaining informed consent, the colonoscope                            was passed under direct vision. Throughout the                            procedure, the patient's blood pressure, pulse, and                            oxygen saturations were monitored continuously. The                            Colonoscope was introduced through the anus and                            advanced to the  the cecum, identified by                            appendiceal orifice and ileocecal valve. The                            colonoscopy was performed without difficulty. The                            patient tolerated the procedure well. The quality                            of the bowel preparation was excellent. The                            ileocecal valve, appendiceal orifice, and rectum                            were photographed. The bowel preparation used was                            Miralax. Scope In: 2:58:33 PM Scope Out: 3:15:41 PM Scope Withdrawal Time: 0 hours 12 minutes 6 seconds  Total Procedure Duration: 0 hours 17 minutes 8 seconds  Findings:                 External and internal hemorrhoids were found during                            retroflexion.                           The exam was otherwise without abnormality on                            direct and retroflexion views. Complications:            No immediate complications. Estimated Blood Loss:     Estimated blood loss: none. Impression:               - External and internal hemorrhoids.                           -  The examination was otherwise normal on direct                            and retroflexion views.                           - No specimens collected. Recommendation:           - Patient has a contact number available for                            emergencies. The signs and symptoms of potential                            delayed complications were discussed with the                            patient. Return to normal activities tomorrow.                            Written discharge instructions were provided to the                            patient.                           - Resume previous diet.                           - Continue present medications.                           - Repeat colonoscopy in 5 years for screening                            purposes. Joe Booparl E Torey Regan, MD 05/06/2018  3:21:02 PM This report has been signed electronically.

## 2018-05-07 ENCOUNTER — Telehealth: Payer: Self-pay

## 2018-05-07 NOTE — Telephone Encounter (Signed)
  Follow up Call-  Call back number 05/06/2018  Post procedure Call Back phone  # [phone]-wife ok to talk to her.  Permission to leave phone message Yes  Some recent data might be hidden     Patient questions:  Do you have a fever, pain , or abdominal swelling? No. Pain Score  0 *  Have you tolerated food without any problems? Yes.    Have you been able to return to your normal activities? Yes.    Do you have any questions about your discharge instructions: Diet   No. Medications  No. Follow up visit  No.  Do you have questions or concerns about your Care? No.  Actions: * If pain score is 4 or above: No action needed, pain <4.

## 2018-07-10 DIAGNOSIS — S29012A Strain of muscle and tendon of back wall of thorax, initial encounter: Secondary | ICD-10-CM | POA: Diagnosis not present

## 2019-02-18 DIAGNOSIS — Z0001 Encounter for general adult medical examination with abnormal findings: Secondary | ICD-10-CM | POA: Diagnosis not present

## 2019-02-23 DIAGNOSIS — Z8249 Family history of ischemic heart disease and other diseases of the circulatory system: Secondary | ICD-10-CM | POA: Diagnosis not present

## 2019-02-23 DIAGNOSIS — Z0001 Encounter for general adult medical examination with abnormal findings: Secondary | ICD-10-CM | POA: Diagnosis not present

## 2019-02-23 DIAGNOSIS — Z1212 Encounter for screening for malignant neoplasm of rectum: Secondary | ICD-10-CM | POA: Diagnosis not present

## 2019-05-04 ENCOUNTER — Telehealth: Payer: Self-pay | Admitting: Internal Medicine

## 2019-05-04 ENCOUNTER — Other Ambulatory Visit: Payer: Self-pay | Admitting: Internal Medicine

## 2019-05-04 MED ORDER — HYDROCORTISONE (PERIANAL) 2.5 % EX CREA: 1.0000 "application " | TOPICAL_CREAM | Freq: Two times a day (BID) | CUTANEOUS | 0 refills | Status: DC

## 2019-05-04 MED ORDER — AMBULATORY NON FORMULARY MEDICATION: 0 refills | Status: DC

## 2019-05-04 NOTE — Telephone Encounter (Signed)
OK 1 refill on both requests

## 2019-05-04 NOTE — Telephone Encounter (Signed)
I spoke with Patient and he reports having a flare of his hemorrhoids due to lifting things for his Mom.  No bleeding, just bulging out somewhat. He started sitz baths last night. He is requesting refills on the Diltiazem gel/lidocaine mixture and also the Anusol creme. Both he wants sent to [facility]. He said if these don't work he will come back in to see [place].

## 2019-05-04 NOTE — Telephone Encounter (Signed)
Patient informed that rx's sent in to [facility].

## 2019-05-08 ENCOUNTER — Telehealth: Payer: Self-pay | Admitting: Internal Medicine

## 2019-05-08 NOTE — Telephone Encounter (Signed)
FYI- Dr. Jeanie Sewer to the patient who reports his prolapsed hemorrhoid unexpectedly burst this morning. He wanted to know how to move forward. The patient was told to continue taking sitz baths, applying the prescribed ointments, and to drink plenty of water to keep his stools nice and soft. The patient was asked to call back next week with an update.

## 2019-08-04 ENCOUNTER — Telehealth: Payer: Self-pay | Admitting: *Deleted

## 2019-08-04 ENCOUNTER — Other Ambulatory Visit: Payer: Self-pay

## 2019-08-04 ENCOUNTER — Ambulatory Visit: Payer: BLUE CROSS/BLUE SHIELD | Admitting: Cardiovascular Disease

## 2019-08-04 ENCOUNTER — Encounter: Payer: Self-pay | Admitting: Cardiovascular Disease

## 2019-08-04 VITALS — BP 116/82 | HR 81 | Ht 72.0 in | Wt 223.4 lb

## 2019-08-04 DIAGNOSIS — Z8249 Family history of ischemic heart disease and other diseases of the circulatory system: Secondary | ICD-10-CM | POA: Diagnosis not present

## 2019-08-04 DIAGNOSIS — R400 Somnolence: Secondary | ICD-10-CM | POA: Diagnosis not present

## 2019-08-04 DIAGNOSIS — R0683 Snoring: Secondary | ICD-10-CM | POA: Diagnosis not present

## 2019-08-04 DIAGNOSIS — E7800 Pure hypercholesterolemia, unspecified: Secondary | ICD-10-CM | POA: Diagnosis not present

## 2019-08-04 HISTORY — DX: Snoring: R06.83

## 2019-08-04 HISTORY — DX: Family history of ischemic heart disease and other diseases of the circulatory system: Z82.49

## 2019-08-04 NOTE — Progress Notes (Signed)
Cardiology Office Note   Date:  08/04/2019   ID:  Joe Alexander, DOB 1980-08-10, MRN [id number] Morning PCP:  Merri Brunette, MD  Cardiologist:   Chilton Si, MD   No chief complaint on file.     History of Present Illness: Joe Alexander is a 39 y.o. male with a family history of premature CAD and hyperlipidemia who presents for follow up.  He was initially seen 12/2014 for and evaluation of chest pain and family history of heart disease.  He has a family history of premature CAD, which a brother who had an MI at age 81 and another at age 29.  He was referred for ETT that was negative for ischemia.  He has a family history of ascending aorta aneurysm and was referred for echo that revealed LVEF 65-70% with normal diastolic function and no evidence of aortic aneurysm.  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  He has been feeling tired and thinks it is because he has not been getting out of the other day and has no exertional chest pain or shortness of breath.  He notes that when he [place] to the court house he parks at the top of the parking deck.  When he gets to the top of the steps he does get short of breath he has not experienced any lower extremity edema, orthopnea. His wife has been snoring.  He was noted in the past and it has gotten better, but it seems to have recurred.  He is unsure whether he has apneic episodes.  He feels rested in the a.m. but typically falls asleep easily throughout the day.  His PCP checked his cholesterol in the winter and his lipids were well have been controlled.  He has not been taking aspirin lately.   Past Medical History:  Diagnosis Date  . Elevated blood-pressure reading without diagnosis of hypertension    Improved with weight loss, HCTZ was stopped  . Family history of premature CAD 08/04/2019  . Pure hypercholesterolemia   . Snoring 08/04/2019    Past Surgical History:  Procedure  Laterality Date  . APPENDECTOMY  1989  . NEVUS EXCISION       Current Outpatient Medications  Medication Sig Dispense Refill  . rosuvastatin (CRESTOR) 10 MG tablet Take 10 mg by mouth daily.     Current Facility-Administered Medications  Medication Dose Route Frequency Provider Last Rate Last Admin  . 0.9 %  sodium chloride infusion  500 mL Intravenous Once Iva Boop, MD        Allergies:   Patient has no known allergies.    Social History:  The patient  reports that he has never smoked. He has never used smokeless tobacco. He reports that he does not drink alcohol or use drugs.   Family History:  The patient's family history includes Anuerysm in his mother; Colon cancer (age of onset: 76) in his father; Diabetes in his father; Heart attack in his brother and brother; Heart attack (age of onset: 51) in his father; Hypertension in his father and mother; Stroke in his father.    ROS:  Please see the history of present illness.   Otherwise, review of systems are positive for face fasciculations.   All other systems are reviewed and negative.    PHYSICAL EXAM: VS:  BP 116/82   Pulse 81   Ht 6' (1.829 m)   Wt 223 lb  6.6 oz (101.3 kg)   SpO2 96%   BMI 30.30 kg/m  , BMI Body mass index is 30.3 kg/m. GENERAL:  Well appearing HEENT: Pupils equal round and reactive, fundi not visualized, oral mucosa unremarkable NECK:  No jugular venous distention, waveform within normal limits, carotid upstroke brisk and symmetric, no bruits LUNGS:  Clear to auscultation bilaterally HEART:  RRR.  PMI not displaced or sustained,S1 and S2 within normal limits, no S3, no S4, no clicks, no rubs, no murmurs ABD:  Flat, positive bowel sounds normal in frequency in pitch, no bruits, no rebound, no guarding, no midline pulsatile mass, no hepatomegaly, no splenomegaly EXT:  2 plus pulses throughout, no edema, no cyanosis no clubbing SKIN:  No rashes no nodules NEURO:  Cranial nerves II through XII  grossly intact, motor grossly intact throughout PSYCH:  Cognitively intact, oriented to person place and time   EKG:  EKG is ordered today. The ekg ordered 12/03/14 demonstrates sinus rhythm 74 bpm. 08/04/2019: Sinus rhythm.  Rate 81 bpm.  Echo 01/12/15: Study Conclusions  - Left ventricle: The cavity size was normal. Systolic function was   vigorous. The estimated ejection fraction was in the range of 65%   to 70%. Wall motion was normal; there were no regional wall   motion abnormalities. Left ventricular diastolic function   parameters were normal. - Aortic valve: Trileaflet; normal thickness leaflets. There was no   regurgitation. - Aortic root: The aortic root was normal in size. - Mitral valve: Structurally normal valve. - Left atrium: The atrium was normal in size. - Right ventricle: The cavity size was normal. Wall thickness was   normal. Systolic function was normal. - Right atrium: The atrium was normal in size. - Tricuspid valve: There was trivial regurgitation. - Pulmonic valve: Structurally normal valve. There was no   regurgitation. - Pulmonary arteries: Systolic pressure was within the normal   range. - Inferior vena cava: The vessel was normal in size. - Pericardium, extracardiac: There was no pericardial effusion.  ETT 01/10/15: Normal   Recent Labs: No results found for requested labs within last 8760 hours.    Lipid Panel    Component Value Date/Time   CHOL 210 (H) 06/18/2012 1328   TRIG 112 06/18/2012 1328   HDL 49 06/18/2012 1328   CHOLHDL 4.3 06/18/2012 1328   VLDL 22 06/18/2012 1328   LDLCALC 139 (H) 06/18/2012 1328   02/08/17: Total cholesterol 191, HDL 40, LDL 126, glucose is 123 Creatinine 1.1, potassium 4.1 TSH 2.46  Wt Readings from Last 3 Encounters:  08/04/19 223 lb 6.6 oz (101.3 kg)  05/06/18 214 lb (97.1 kg)  03/28/18 214 lb 6 oz (97.2 kg)      ASSESSMENT AND PLAN:  # Family history of premature CAD: # Hyperlipidemia: He is  asymptomatic.  ETT was negative for ischemia.  He stopped taking aspirin which is quite reasonable given his overall risk of GI bleeding on lifelong antiplatelets.  We will get a coronary calcium score to better understand history risk.  Continue rosuvastatin and aggressive lipid management.  # Family history of aortic aneurysm: Mr. Nippert's mom had thoracic and aortic aneurysms.  None noted on his echo 12/2014.  Calcium score as above.  # Snoring:  Mr. Gannett's wife is concerned about his snoring and he reports daytime somnolence.  We will get a sleep study.   Current medicines are reviewed at length with the patient today.  The patient does not have concerns regarding medicines.  The following changes have been made: none  Labs/ tests ordered today include:  Orders Placed This Encounter  Procedures  . CT CARDIAC SCORING  . EKG 12-Lead  . Split night study     Disposition:   FU with Dr. Elmarie Shiley C. Duke Salvia in 2 years.    Signed, Chilton Si, MD  08/04/2019 4:53 PM    [facility]

## 2019-08-04 NOTE — Telephone Encounter (Signed)
Home sleep test ordered 

## 2019-08-04 NOTE — Telephone Encounter (Signed)
-----   Message from Burnell Blanks, LPN sent at 6/81/1572  4:44 PM EDT ----- HELLO ALL This patient is wanting an at home sleep study. Not sure I even ordered it right, just let me know if I didn't Thanks [place]

## 2019-08-04 NOTE — Patient Instructions (Addendum)
Medication Instructions:  Your physician recommends that you continue on your current medications as directed. Please refer to the Current Medication list given to you today.  *If you need a refill on your cardiac medications before your next appointment, please call your pharmacy*  Lab Work: NONE   Testing/Procedures: Your physician has recommended that you have a sleep study. This test records several body functions during sleep, including: brain activity, eye movement, oxygen and carbon dioxide blood levels, heart rate and rhythm, breathing rate and rhythm, the flow of air through your mouth and nose, snoring, body muscle movements, and chest and belly movement. THE OFFICE WILL CALL YOU TO ARRANGE   CALCIUM SCORE - THIS WILL COST $150 OUT OF POCKET   Follow-Up: At [facility], you and your health needs are our priority.  As part of our continuing mission to provide you with exceptional heart care, we have created designated Provider Care Teams.  These Care Teams include your primary Cardiologist (physician) and Advanced Practice Providers (APPs -  Physician Assistants and Nurse Practitioners) who all work together to provide you with the care you need, when you need it.  We recommend signing up for the patient portal called "MyChart".  Sign up information is provided on this After Visit Summary.  MyChart is used to connect with patients for Virtual Visits (Telemedicine).  Patients are able to view lab/test results, encounter notes, upcoming appointments, etc.  Non-urgent messages can be sent to your provider as well.   To learn more about what you can do with MyChart, go to [email or web address].    Your next appointment:   24 month(s)  You will receive a reminder letter in the mail two months in advance. If you don't receive a letter, please call our office to schedule the follow-up appointment.   The format for your next appointment:   In Person  Provider:   You may see Chilton Si, MD or one of the following Advanced Practice Providers on your designated Care Team:    Corine Shelter, PA-C  [place], [place]  Edd Fabian, [place]

## 2019-08-07 ENCOUNTER — Ambulatory Visit (INDEPENDENT_AMBULATORY_CARE_PROVIDER_SITE_OTHER)
Admission: RE | Admit: 2019-08-07 | Discharge: 2019-08-07 | Disposition: A | Discharge status: Home / Self Care | Payer: Self-pay | Source: Ambulatory Visit | Attending: Cardiovascular Disease | Admitting: Cardiovascular Disease

## 2019-08-07 ENCOUNTER — Other Ambulatory Visit: Payer: Self-pay

## 2019-08-07 DIAGNOSIS — Z8249 Family history of ischemic heart disease and other diseases of the circulatory system: Secondary | ICD-10-CM

## 2019-08-19 ENCOUNTER — Telehealth: Payer: Self-pay | Admitting: *Deleted

## 2019-08-19 DIAGNOSIS — Z5181 Encounter for therapeutic drug level monitoring: Secondary | ICD-10-CM

## 2019-08-19 DIAGNOSIS — E7800 Pure hypercholesterolemia, unspecified: Secondary | ICD-10-CM

## 2019-08-19 NOTE — Telephone Encounter (Signed)
Advised patient, he will come for labs soon

## 2019-08-19 NOTE — Telephone Encounter (Signed)
-----   Message from Chilton Si, MD sent at 08/10/2019  7:58 AM EDT ----- Study shows a very small amount of calcium.  This is not enough to be causing problems now, but is more than would be expected for his age.  Please, have fasting lipids checked as planned.  His LDL needs to be less than 70.

## 2019-10-05 ENCOUNTER — Telehealth: Payer: Self-pay | Admitting: *Deleted

## 2019-10-05 NOTE — Telephone Encounter (Signed)
Patient notified of HST appointment details. [employer] order ID [id number]. Valid dates 10/05/19 to 04/02/20.

## 2019-12-18 ENCOUNTER — Ambulatory Visit (HOSPITAL_BASED_OUTPATIENT_CLINIC_OR_DEPARTMENT_OTHER): Payer: BC Managed Care – PPO | Attending: Cardiovascular Disease | Admitting: Cardiovascular Disease

## 2019-12-18 ENCOUNTER — Other Ambulatory Visit: Payer: Self-pay

## 2019-12-18 DIAGNOSIS — R400 Somnolence: Secondary | ICD-10-CM

## 2019-12-18 DIAGNOSIS — R0683 Snoring: Secondary | ICD-10-CM | POA: Diagnosis not present

## 2019-12-18 DIAGNOSIS — G4733 Obstructive sleep apnea (adult) (pediatric): Secondary | ICD-10-CM | POA: Insufficient documentation

## 2020-01-06 ENCOUNTER — Encounter (HOSPITAL_BASED_OUTPATIENT_CLINIC_OR_DEPARTMENT_OTHER): Payer: Self-pay | Admitting: Cardiovascular Disease

## 2020-01-06 NOTE — Procedures (Signed)
    Patient Name: Joe Alexander, Joe Alexander Date: 12/20/2019 Gender: Male D.O.B: 01/17/1981 Age (years): 39 Referring Provider: Chilton Si Height (inches): 72 Interpreting Physician: Nicki Guadalajara MD, ABSM Weight (lbs): 215 RPSGT: [place] BMI: 29 MRN: [id number] Neck Size: 15.00  CLINICAL INFORMATION Sleep Study Type: HST  Indication for sleep study: snoring, fatigue, daytime sleepiness  Epworth Sleepiness Score: 11  SLEEP STUDY TECHNIQUE A multi-channel overnight portable sleep study was performed. The channels recorded were: nasal airflow, thoracic respiratory movement, and oxygen saturation with a pulse oximetry. Snoring was also monitored.  MEDICATIONS Patient self administered medications include: N/A.  SLEEP ARCHITECTURE Patient was studied for 458.2 minutes. The sleep efficiency was 100.0 % and the patient was supine for 97.5%. The arousal index was 0.0 per hour.  RESPIRATORY PARAMETERS The overall AHI was 18.2 per hour, with a central apnea index of 0.0 per hour.  The oxygen nadir was 87% during sleep.  CARDIAC DATA Mean heart rate during sleep was 67.1 bpm.  IMPRESSIONS - Moderate obstructive sleep apnea occurred during this study (AHI = 18.2/h). The severity of sleep disordered breathing during REM sleep cannot be assessed on this home study. - No significant central sleep apnea occurred during this study (CAI = 0.0/h). - Mild oxygen desaturation to a nadir of 87%. - Patient snored 17.7% during the sleep.  DIAGNOSIS - Obstructive Sleep Apnea (G47.33)  RECOMMENDATIONS - In this symptomatic patient recommend CPAP therapy. Consider an in-lab study if it can be approved by insurance; otherwise,  initiate Auto-PAP with EPR at 6-16 cm of water.  - Effort should be made to optimize nasal and oropharyngeal patency. - Avoid alcohol, sedatives and other CNS depressants that may worsen sleep apnea and disrupt normal sleep architecture. - Sleep  hygiene should be reviewed to assess factors that may improve sleep quality. - Weight management and regular exercise should be initiated or continued. - Recommend a download in 30 days following CPAP initiation and sleep clinic evaluation after 4 weeks of therapy.   [Electronically signed] 01/06/2020 10:36 AM  Nicki Guadalajara MD, [facility], ABSM Diplomate, American Board of Sleep Medicine   NPI: [id number] [facility] PH: [phone]   FX: [phone] ACCREDITED BY THE AMERICAN ACADEMY OF SLEEP MEDICINE

## 2020-01-11 ENCOUNTER — Telehealth: Payer: Self-pay | Admitting: *Deleted

## 2020-01-11 ENCOUNTER — Other Ambulatory Visit: Payer: Self-pay | Admitting: Cardiovascular Disease

## 2020-01-11 DIAGNOSIS — G4733 Obstructive sleep apnea (adult) (pediatric): Secondary | ICD-10-CM

## 2020-01-11 DIAGNOSIS — IMO0002 Reserved for concepts with insufficient information to code with codable children: Secondary | ICD-10-CM

## 2020-01-11 NOTE — Telephone Encounter (Signed)
Patient notified of sleep study results and recommendations. He agrees to proceed with titration study, however his [employer] will be changing in October to a different insurance. I told the patient that if he wants, I will run it through [facility] to see if they cover it. If not I will go ahead and order APAP per Dr Landry Dyke orders. When I tried to run it through his BCBS but it tells me that he is not active. When I questioned him about his BC[employer] status he wasn't sure. Stated that he will call them to see what his status is. Is he has already been terminated he requests to wait until his new insurance becomes effective in October.

## 2020-01-12 ENCOUNTER — Telehealth: Payer: Self-pay | Admitting: *Deleted

## 2020-01-12 NOTE — Telephone Encounter (Signed)
Called and left message that order for CPAP machine has been sent to [facility]. Contact information to [facility] was also provided to patient.

## 2020-02-25 DIAGNOSIS — Z0001 Encounter for general adult medical examination with abnormal findings: Secondary | ICD-10-CM | POA: Diagnosis not present

## 2020-02-25 DIAGNOSIS — Z13220 Encounter for screening for lipoid disorders: Secondary | ICD-10-CM | POA: Diagnosis not present

## 2020-02-29 DIAGNOSIS — Z0001 Encounter for general adult medical examination with abnormal findings: Secondary | ICD-10-CM | POA: Diagnosis not present

## 2020-03-28 DIAGNOSIS — G4733 Obstructive sleep apnea (adult) (pediatric): Secondary | ICD-10-CM | POA: Diagnosis not present

## 2020-04-08 DIAGNOSIS — E7800 Pure hypercholesterolemia, unspecified: Secondary | ICD-10-CM | POA: Diagnosis not present

## 2020-04-28 DIAGNOSIS — G4733 Obstructive sleep apnea (adult) (pediatric): Secondary | ICD-10-CM | POA: Diagnosis not present

## 2020-05-02 DIAGNOSIS — D225 Melanocytic nevi of trunk: Secondary | ICD-10-CM | POA: Diagnosis not present

## 2020-05-02 DIAGNOSIS — L858 Other specified epidermal thickening: Secondary | ICD-10-CM | POA: Diagnosis not present

## 2020-05-02 DIAGNOSIS — D485 Neoplasm of uncertain behavior of skin: Secondary | ICD-10-CM | POA: Diagnosis not present

## 2020-05-29 DIAGNOSIS — G4733 Obstructive sleep apnea (adult) (pediatric): Secondary | ICD-10-CM | POA: Diagnosis not present

## 2020-08-22 DIAGNOSIS — H6690 Otitis media, unspecified, unspecified ear: Secondary | ICD-10-CM | POA: Diagnosis not present

## 2020-09-25 IMAGING — CT CT CARDIAC CORONARY ARTERY CALCIUM SCORE
3 series · 14 of 20 positions shown, 15 images · non-contrast
Comparison: None.
COMPARISON: None.

Addendum:
EXAM:
OVER-READ INTERPRETATION  CT CHEST

The following report is an over-read performed by radiologist Dr.
Ashwani Gathers [REDACTED] on 08/07/2019. This over-read
does not include interpretation of cardiac or coronary anatomy or
pathology. The calcium score interpretation by the cardiologist is
attached.
CLINICAL DATA: Risk stratification
Coronary Calcium Score
TECHNIQUE: The patient was scanned on a Siemens Force scanner. Axial
non-contrast 3 mm slices were carried out through the heart. The
data set was analyzed on a dedicated work station and scored using
the Agatson method.

[Series 2: casc 3.0 bv41 2 bestdiast 68 % · axial · 0.41mm/px · z∈[-229,-157]mm · 4 of 42 slices shown, 5 images]
[im 9/42  vessel]
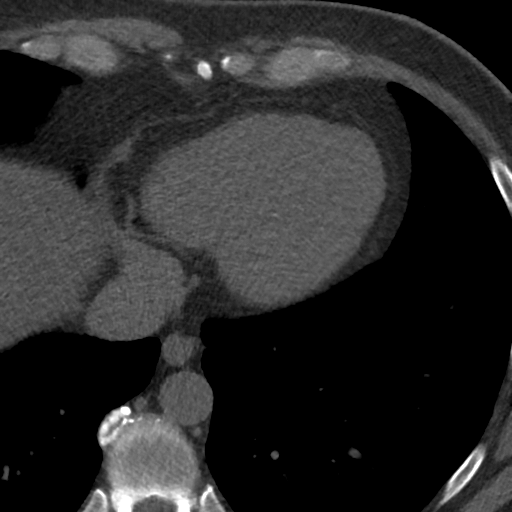
[im 9/42  lung]
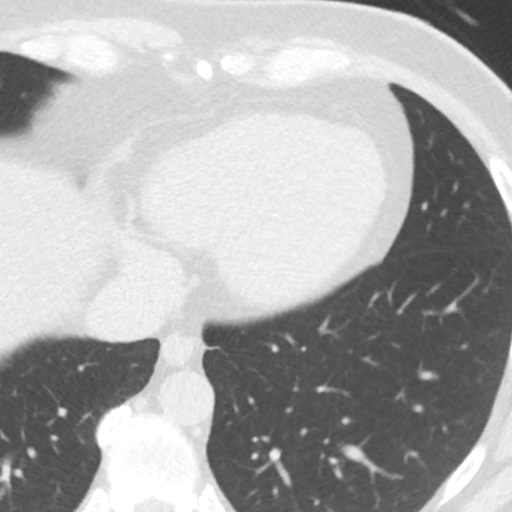
[im 17/42  vessel]
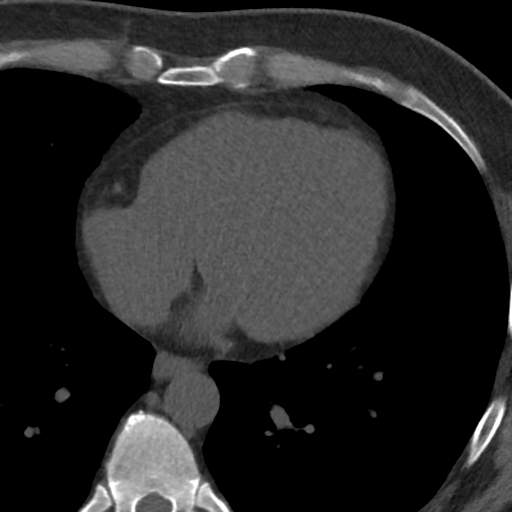
[im 25/42  vessel]
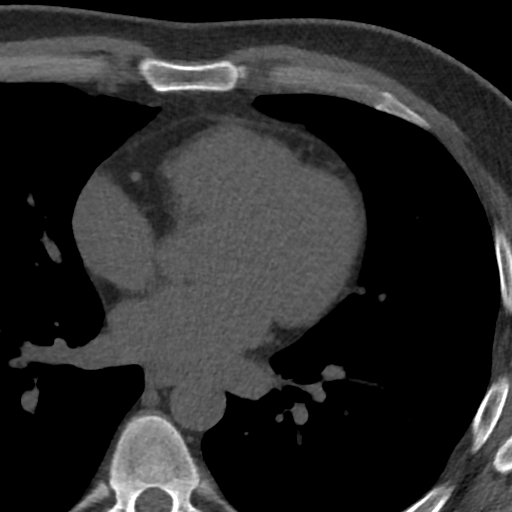
[im 33/42  vessel]
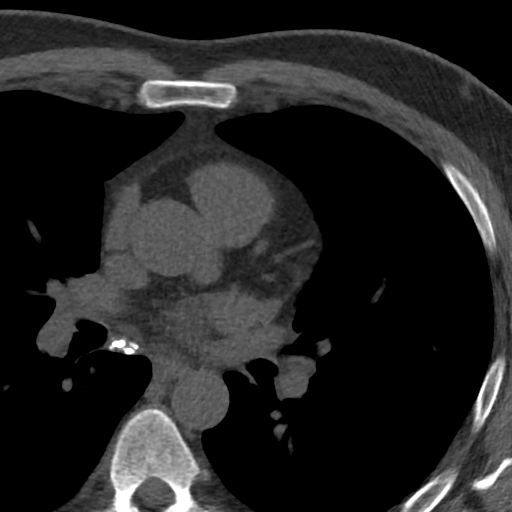

[Series 3: lung 69 % · axial · 0.76mm/px · z∈[-234,-150]mm · 5 of 42 slices shown]
[im 7/42  lung]
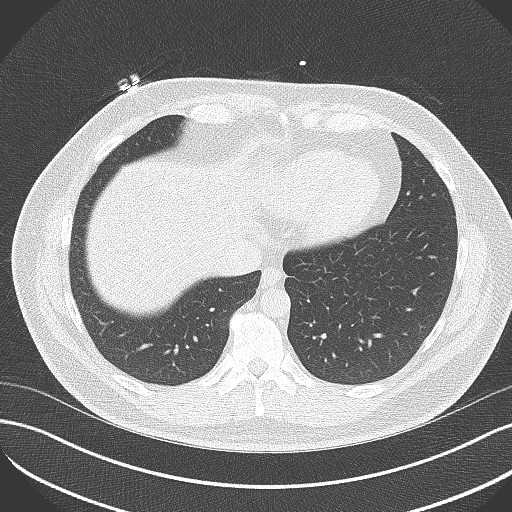
[im 14/42  lung]
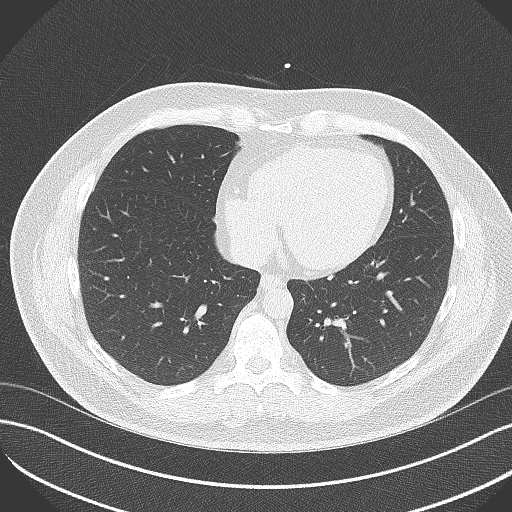
[im 21/42  lung]
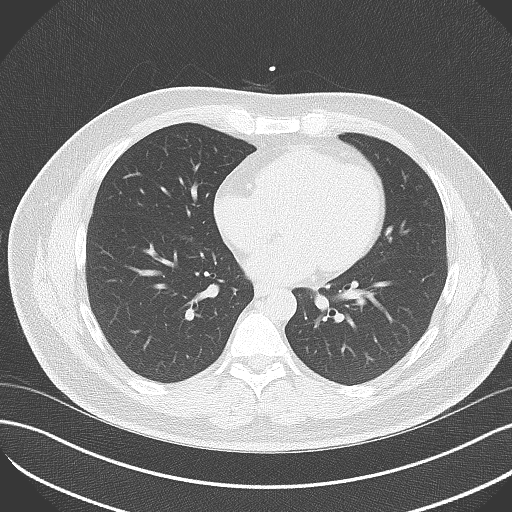
[im 28/42  lung]
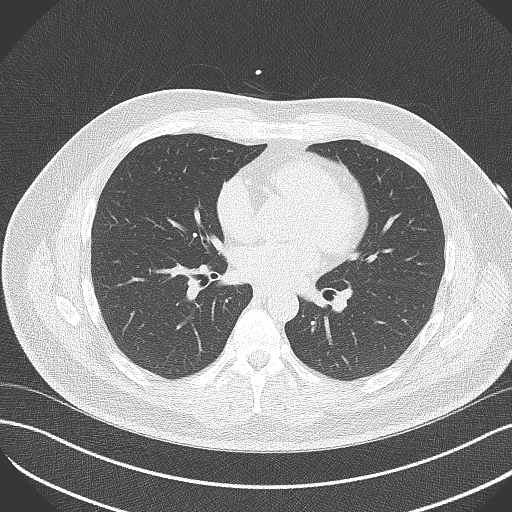
[im 35/42  lung]
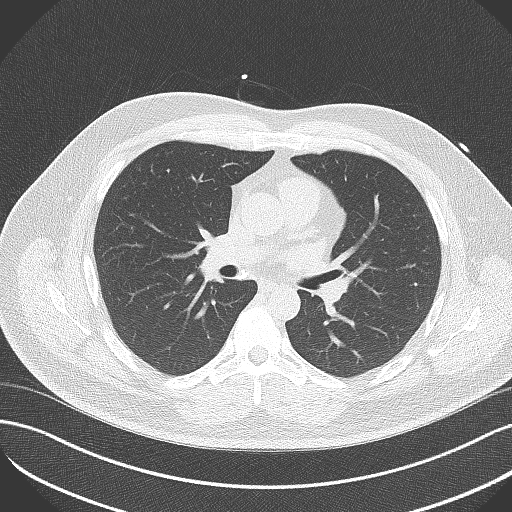

[Series 4: lung st 69 % · axial · 0.76mm/px · z∈[-234,-150]mm · 5 of 42 slices shown]
[im 7/42  lung]
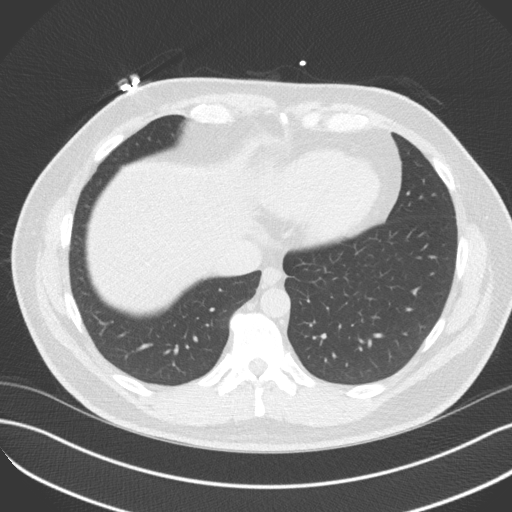
[im 14/42  lung]
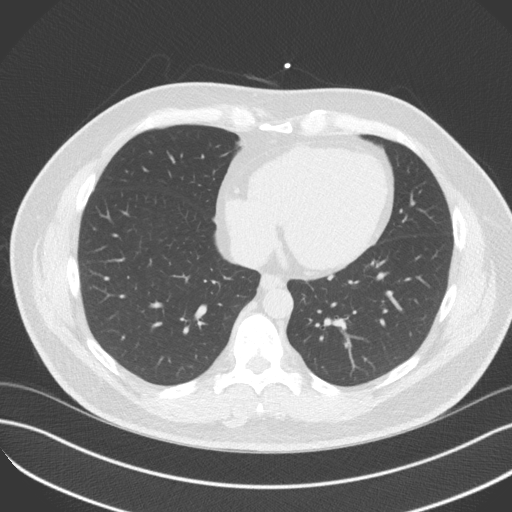
[im 21/42  lung]
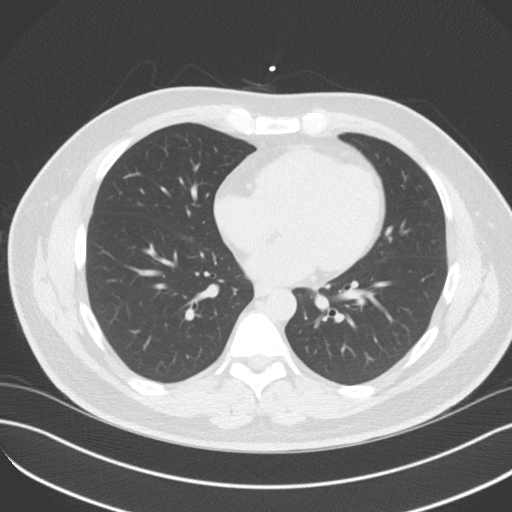
[im 28/42  lung]
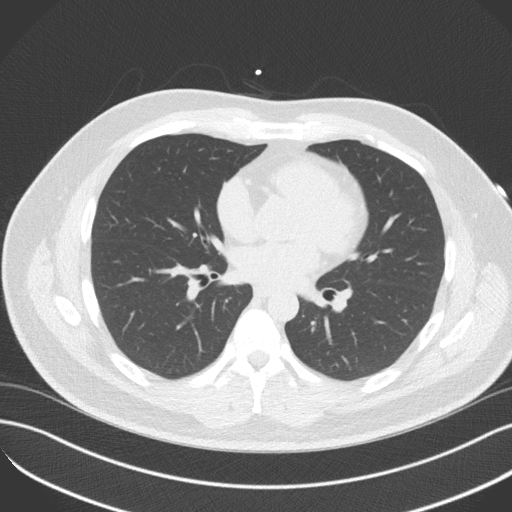
[im 35/42  lung]
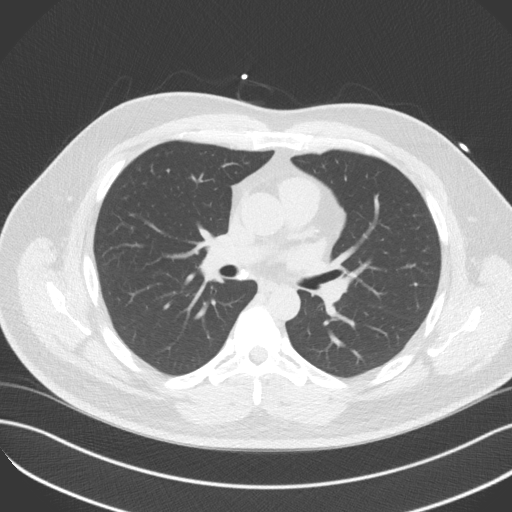

[14 of 20 positions shown; findings below may reference images not displayed]

FINDINGS: Vascular: Normal aortic caliber.

Mediastinum/Nodes: Calcified lymph nodes in the mediastinum are
consistent with old granulomatous disease.

Lungs/Pleura: No pleural fluid. Calcified right lower lobe
granuloma.

Upper Abdomen: Moderate hepatic steatosis.

Musculoskeletal: No acute osseous abnormality.
IMPRESSION: 1.  No acute findings in the imaged extracardiac chest.
2. Hepatic steatosis.
FINDINGS: Non-cardiac: See separate report from [REDACTED].

Ascending Aorta: Normal size, measuring 33 mm at the mid ascending
aorta, measured double oblique at PA bifurcation. No significant
calcification.

Pericardium: Normal

Coronary arteries: Arise from normal coronary cusps.
IMPRESSION: Coronary calcium score of 5. This was 79th percentile for age and
sex matched control. Lowest assumed age is 45 for calcium score, so
this was used in the calculation.

Felipe Zang

*** End of Addendum ***
EXAM:
OVER-READ INTERPRETATION  CT CHEST

The following report is an over-read performed by radiologist Dr.
Ashwani Gathers [REDACTED] on 08/07/2019. This over-read
does not include interpretation of cardiac or coronary anatomy or
pathology. The calcium score interpretation by the cardiologist is
attached.
FINDINGS: Vascular: Normal aortic caliber.

Mediastinum/Nodes: Calcified lymph nodes in the mediastinum are
consistent with old granulomatous disease.

Lungs/Pleura: No pleural fluid. Calcified right lower lobe
granuloma.

Upper Abdomen: Moderate hepatic steatosis.

Musculoskeletal: No acute osseous abnormality.
IMPRESSION: 1.  No acute findings in the imaged extracardiac chest.
2. Hepatic steatosis.

## 2021-02-27 DIAGNOSIS — Z0001 Encounter for general adult medical examination with abnormal findings: Secondary | ICD-10-CM | POA: Diagnosis not present

## 2021-05-31 DIAGNOSIS — Z1212 Encounter for screening for malignant neoplasm of rectum: Secondary | ICD-10-CM | POA: Diagnosis not present

## 2021-05-31 DIAGNOSIS — Z0001 Encounter for general adult medical examination with abnormal findings: Secondary | ICD-10-CM | POA: Diagnosis not present

## 2021-05-31 DIAGNOSIS — Z0000 Encounter for general adult medical examination without abnormal findings: Secondary | ICD-10-CM | POA: Diagnosis not present

## 2021-07-26 DIAGNOSIS — E7800 Pure hypercholesterolemia, unspecified: Secondary | ICD-10-CM | POA: Diagnosis not present

## 2021-07-26 DIAGNOSIS — J8410 Pulmonary fibrosis, unspecified: Secondary | ICD-10-CM | POA: Diagnosis not present

## 2022-01-03 ENCOUNTER — Ambulatory Visit: Payer: BC Managed Care – PPO | Admitting: Dermatology

## 2022-05-18 DIAGNOSIS — M545 Low back pain, unspecified: Secondary | ICD-10-CM | POA: Diagnosis not present

## 2022-05-18 DIAGNOSIS — R058 Other specified cough: Secondary | ICD-10-CM | POA: Diagnosis not present

## 2022-06-04 DIAGNOSIS — Z0001 Encounter for general adult medical examination with abnormal findings: Secondary | ICD-10-CM | POA: Diagnosis not present

## 2022-06-07 DIAGNOSIS — Z0000 Encounter for general adult medical examination without abnormal findings: Secondary | ICD-10-CM | POA: Diagnosis not present

## 2022-06-07 DIAGNOSIS — Z800 Family history of malignant neoplasm of digestive organs: Secondary | ICD-10-CM | POA: Diagnosis not present

## 2022-06-07 DIAGNOSIS — G4733 Obstructive sleep apnea (adult) (pediatric): Secondary | ICD-10-CM | POA: Diagnosis not present

## 2022-06-07 DIAGNOSIS — I2510 Atherosclerotic heart disease of native coronary artery without angina pectoris: Secondary | ICD-10-CM | POA: Diagnosis not present

## 2022-06-07 DIAGNOSIS — Z8249 Family history of ischemic heart disease and other diseases of the circulatory system: Secondary | ICD-10-CM | POA: Diagnosis not present

## 2022-06-13 DIAGNOSIS — B349 Viral infection, unspecified: Secondary | ICD-10-CM | POA: Diagnosis not present

## 2022-06-13 DIAGNOSIS — J029 Acute pharyngitis, unspecified: Secondary | ICD-10-CM | POA: Diagnosis not present

## 2022-06-15 DIAGNOSIS — J069 Acute upper respiratory infection, unspecified: Secondary | ICD-10-CM | POA: Diagnosis not present

## 2022-09-04 DIAGNOSIS — E7800 Pure hypercholesterolemia, unspecified: Secondary | ICD-10-CM | POA: Diagnosis not present

## 2022-09-06 DIAGNOSIS — Z8249 Family history of ischemic heart disease and other diseases of the circulatory system: Secondary | ICD-10-CM | POA: Diagnosis not present

## 2022-09-06 DIAGNOSIS — I2510 Atherosclerotic heart disease of native coronary artery without angina pectoris: Secondary | ICD-10-CM | POA: Diagnosis not present

## 2023-05-13 ENCOUNTER — Encounter: Payer: Self-pay | Admitting: Internal Medicine

## 2023-10-07 ENCOUNTER — Ambulatory Visit: Admitting: Family Medicine

## 2023-10-07 ENCOUNTER — Other Ambulatory Visit: Payer: Self-pay

## 2023-10-07 VITALS — BP 130/92 | HR 80 | Ht 72.0 in | Wt 213.0 lb

## 2023-10-07 DIAGNOSIS — M79671 Pain in right foot: Secondary | ICD-10-CM

## 2023-10-07 DIAGNOSIS — M7662 Achilles tendinitis, left leg: Secondary | ICD-10-CM

## 2023-10-07 DIAGNOSIS — M7661 Achilles tendinitis, right leg: Secondary | ICD-10-CM | POA: Diagnosis not present

## 2023-10-07 DIAGNOSIS — M79672 Pain in left foot: Secondary | ICD-10-CM | POA: Diagnosis not present

## 2023-10-07 MED ORDER — NITROGLYCERIN 0.2 MG/HR TD PT24: MEDICATED_PATCH | TRANSDERMAL | 1 refills | Status: AC

## 2023-10-07 NOTE — Patient Instructions (Addendum)
 Thank you for coming in today.   Gel heel cushion  Night splints  Please work on the home exercises the athletic trainer went over with you:    Nitroglycerin Protocol Apply 1/4 nitroglycerin patch to affected area daily. Change position of patch within the affected area every 24 hours. You may experience a headache during the first 1-2 weeks of using the patch, these should subside. If you experience headaches after beginning nitroglycerin patch treatment, you may take your preferred over the counter pain reliever. Another side effect of the nitroglycerin patch is skin irritation or rash related to patch adhesive. Please notify our office if you develop more severe headaches or rash, and stop the patch. Tendon healing with nitroglycerin patch may require 12 to 24 weeks depending on the extent of injury. Men should not use if taking Viagra, Cialis, or Levitra.  Do not use if you have migraines or rosacea.

## 2023-10-07 NOTE — Progress Notes (Signed)
   Joanna Muck, PhD, LAT, ATC acting as a scribe for Garlan Juniper, MD.  Joe Alexander is a 43 y.o. male who presents to [facility] at [facility] today for bilat Achilles pain ongoing since Nov-Dec, worsening over the last month. Pain is disturbing his sleep at night. Pt locates pain to distal Achilles tendon and into the plantar and posterior aspect of the calcaneous  Swelling: no Aggravates: walking, PF Treatments tried: IBU  Pertinent review of systems: No fevers or chills  Relevant historical information: Snoring and hypercholesterolemia. Patient works as an Pensions consultant.  Exam:  BP (!) 130/92   Pulse 80   Ht 6' (1.829 m)   Wt 213 lb (96.6 kg)   SpO2 97%   BMI 28.89 kg/m  General: Well Developed, well nourished, and in no acute distress.   MSK: Heels bilaterally mild swelling posterior calcaneus otherwise normal. Normal motion.  Tender palpation posterior calcaneus.  Intact strength.    Lab and Radiology Results  Diagnostic Limited MSK Ultrasound of: Achilles tendon insertion bilaterally Achilles tendon is normal in size and appearance bilaterally as it approaches the insertion onto the posterior calcaneus. In both heels patient has calcific changes consistent with chronic calcific tendinitis. Impression: Chronic calcific tendinitis bilateral calcaneus     Assessment and Plan: 43 y.o. male with bilateral chronic calcific Achilles tendinitis.  Plan to treat with eccentric exercises night splints heel lifts and nitroglycerin patch protocol.  Recheck in 1 month if not improving consider shockwave treatment.   PDMP not reviewed this encounter. Orders Placed This Encounter  Procedures   US  LIMITED JOINT SPACE STRUCTURES LOW BILAT(NO LINKED CHARGES)    Reason for Exam (SYMPTOM  OR DIAGNOSIS REQUIRED):   bilateral Achilles tendon pain    Preferred imaging location?:   [facility]   Meds ordered this encounter   Medications   nitroGLYCERIN (NITRODUR - DOSED IN MG/24 HR) 0.2 mg/hr patch    Sig: Cut and place a quarter of the patch over the affected area.    Dispense:  30 patch    Refill:  1     Discussed warning signs or symptoms. Please see discharge instructions. Patient expresses understanding.   The above documentation has been reviewed and is accurate and complete Garlan Juniper, M.D.

## 2023-11-11 ENCOUNTER — Ambulatory Visit: Admitting: Family Medicine

## 2023-11-11 VITALS — BP 122/80 | HR 76 | Ht 72.0 in | Wt 215.0 lb

## 2023-11-11 DIAGNOSIS — M7662 Achilles tendinitis, left leg: Secondary | ICD-10-CM

## 2023-11-11 DIAGNOSIS — M7661 Achilles tendinitis, right leg: Secondary | ICD-10-CM | POA: Diagnosis not present

## 2023-11-11 NOTE — Progress Notes (Signed)
   LILLETTE Ileana Collet, PhD, LAT, ATC acting as a scribe for Artist Lloyd, MD.  Joe Alexander is a 43 y.o. male who presents to [facility] at [facility] today for f/u bilat calcific Achilles tendinitis. Pt was last seen by Dr. Lloyd on 10/07/23 and was advised to use night splints, heel lifts, prescribed nitroglycerin  patches, and taught HEP.  Today, pt reports his Achilles aren't feeling much better. He's been wearing the night splints and using the nitroglycerin   Patches. He notes an instance were he started running, to get out of the rain, and felt a pull in both Achilles. Compliant w/ HEP.  Pertinent review of systems: No fevers or chills  Relevant historical information: Hyper cholesterolemia  Exam:  BP 122/80   Pulse 76   Ht 6' (1.829 m)   Wt 215 lb (97.5 kg)   SpO2 98%   BMI 29.16 kg/m  General: Well Developed, well nourished, and in no acute distress.   MSK: Achilles tendons bilaterally normal. Mildly tender palpation at calcaneus insertion. Normal foot and ankle motion. Intact strength.    Lab and Radiology Results                Extracorporeal Shockwave Therapy Note    Patient is being treated today with ECSWT. Informed consent was obtained and patient tolerated procedure well.   Therapy performed by Artist Lloyd  Condition treated: Bilateral calcific Achilles tendinitis Treatment preset used: Tendinitis Energy used: 120 mJ Frequency used: 15 Hz Number of pulses: 4000 total.  2000 on each side Head Size: Medium Treatment #1 of #4      Assessment and Plan: 43 y.o. male with bilateral chronic calcific tendinitis.  Plan for shockwave starting today.  He already has done a fair amount of home exercise program and nitroglycerin  patch protocol.  Plan to schedule with my partner Dr. Leonce in 1 week for shockwave #2 of 4.  No charge for shockwave today as the billing was for the office visit and consultation only. PDMP not reviewed this  encounter. No orders of the defined types were placed in this encounter.  No orders of the defined types were placed in this encounter.    Discussed warning signs or symptoms. Please see discharge instructions. Patient expresses understanding.   The above documentation has been reviewed and is accurate and complete Artist Lloyd, M.D.

## 2023-11-11 NOTE — Patient Instructions (Addendum)
 Thank you for coming in today.   Schedule weekly with Dr. Leonce for the next 3 weeks

## 2023-11-22 ENCOUNTER — Ambulatory Visit: Payer: Self-pay | Admitting: Sports Medicine

## 2023-11-22 VITALS — Ht 72.0 in

## 2023-11-22 DIAGNOSIS — M79671 Pain in right foot: Secondary | ICD-10-CM

## 2023-11-22 DIAGNOSIS — M79672 Pain in left foot: Secondary | ICD-10-CM

## 2023-11-22 DIAGNOSIS — M7661 Achilles tendinitis, right leg: Secondary | ICD-10-CM

## 2023-11-22 DIAGNOSIS — M7662 Achilles tendinitis, left leg: Secondary | ICD-10-CM

## 2023-11-22 NOTE — Progress Notes (Signed)
   Joe Alexander [facility] [address] [place] [address] Phone: [phone]   Extracorporeal Shockwave Therapy Note    Patient is being treated today with ECSWT. Informed consent was obtained and patient tolerated procedure well.   Therapy performed by Joe Alexander  Condition treated: Bilateral calcific Achilles tendinitis Treatment preset used: Tendinitis Energy used: 120 mJ Frequency used: 15 Hz Number of pulses: 4000 total. 2000 each side Head Size: Medium Treatment #2 of #4  Electronically signed by:  Joe [facility] 9:34 AM 11/22/23

## 2023-11-29 ENCOUNTER — Ambulatory Visit (INDEPENDENT_AMBULATORY_CARE_PROVIDER_SITE_OTHER): Payer: Self-pay | Admitting: Sports Medicine

## 2023-11-29 DIAGNOSIS — M7661 Achilles tendinitis, right leg: Secondary | ICD-10-CM

## 2023-11-29 DIAGNOSIS — M7662 Achilles tendinitis, left leg: Secondary | ICD-10-CM

## 2023-11-29 DIAGNOSIS — M79672 Pain in left foot: Secondary | ICD-10-CM

## 2023-11-29 DIAGNOSIS — M79671 Pain in right foot: Secondary | ICD-10-CM

## 2023-11-29 NOTE — Progress Notes (Signed)
   Morene Mace [facility] [address] [place] [address] Phone: [phone]   Extracorporeal Shockwave Therapy Note    Patient is being treated today with ECSWT. Informed consent was obtained and patient tolerated procedure well.   Therapy performed by Morene Mace  Condition treated: Bilateral calcific tendinitis Treatment preset used: Tendinitis Energy used: 120 mJ Frequency used: 15 hz Number of pulses: 4000 total.  2000 each side Head Size: Medium Treatment #3   Patient so far has not felt any improvement in symptoms.  Recommend waiting 1 to 2 weeks.  If patient sees improvement, could follow-up for fourth treatment.  If no improvement, would recommend follow-up in clinic to discuss next steps in treatment plan.  Electronically signed by:  Morene Mace [facility] 8:39 AM 11/29/23

## 2024-01-14 ENCOUNTER — Encounter: Payer: Self-pay | Admitting: Family Medicine

## 2024-01-14 ENCOUNTER — Ambulatory Visit: Admitting: Family Medicine

## 2024-01-14 VITALS — BP 126/94 | HR 86 | Ht 72.0 in | Wt 215.0 lb

## 2024-01-14 DIAGNOSIS — M7661 Achilles tendinitis, right leg: Secondary | ICD-10-CM | POA: Diagnosis not present

## 2024-01-14 DIAGNOSIS — M79672 Pain in left foot: Secondary | ICD-10-CM | POA: Diagnosis not present

## 2024-01-14 DIAGNOSIS — M7662 Achilles tendinitis, left leg: Secondary | ICD-10-CM

## 2024-01-14 DIAGNOSIS — M79671 Pain in right foot: Secondary | ICD-10-CM

## 2024-01-14 NOTE — Progress Notes (Signed)
   I, Joe Alexander, CMA acting as a scribe for Artist Lloyd, MD.  Joe Alexander is a 43 y.o. male who presents to [facility] at [facility] today for cont'd bilat calcific Achilles tendinitis. Pt was last seen by Dr. Leonce on 11/29/23 for his 3rd ECSWT treatment.   Today, pt reports no change in sx with shockwave therapy. Feels that sx are more noticeable right now. Sx worse with descending stairs. Has not been able to run since 03/2023. Denies visible swelling. Stopped wearing nighttime brace over the past few weeks. Did not get any relief with Nitroglycerin  Protocol.  Additionally patient has been doing home exercise program which has not helped.  Pertinent review of systems: No fevers or chills  Relevant historical information: Hypercholesterolemia.   Exam:  BP (!) 126/94   Pulse 86   Ht 6' (1.829 m)   Wt 215 lb (97.5 kg)   SpO2 98%   BMI 29.16 kg/m  General: Well Developed, well nourished, and in no acute distress.   MSK: Bilateral ankles normal appearing Tender palpation distal Achilles tendon.  Normal motion and strength.    Lab and Radiology Results  Bilateral ankle x-rays have been ordered but not yet completed.     Assessment and Plan: 43 y.o. male with bilateral Achilles tendon pain attributable to chronic calcific Achilles tendinitis. Patient has failed to improve with standard home exercise program directed by physician and nitroglycerin  patch protocol and even shockwave therapy.  At this point he has not improved.  Plan for x-ray and MRI.  Anticipate either direct referral to orthopedic surgery or recheck following MRI.   PDMP not reviewed this encounter. Orders Placed This Encounter  Procedures   DG Os Calcis Left    Standing Status:   Future    Expiration Date:   01/13/2025    Reason for Exam (SYMPTOM  OR DIAGNOSIS REQUIRED):   bilateral heel pain    Preferred imaging location?:   [facility] [place]   DG Os Calcis Right    Standing  Status:   Future    Expiration Date:   01/13/2025    Reason for Exam (SYMPTOM  OR DIAGNOSIS REQUIRED):   bilateral heel pain    Preferred imaging location?:   [facility]   MR ANKLE RIGHT WO CONTRAST    Standing Status:   Future    Expiration Date:   01/13/2025    What is the patient's sedation requirement?:   No Sedation    Does the patient have a pacemaker or implanted devices?:   No    Preferred imaging location?:   [facility]   MR ANKLE LEFT WO CONTRAST    Standing Status:   Future    Expiration Date:   01/13/2025    What is the patient's sedation requirement?:   No Sedation    Does the patient have a pacemaker or implanted devices?:   No    Preferred imaging location?:   [facility]   No orders of the defined types were placed in this encounter.    Discussed warning signs or symptoms. Please see discharge instructions. Patient expresses understanding.   The above documentation has been reviewed and is accurate and complete Artist Lloyd, M.D.

## 2024-01-14 NOTE — Patient Instructions (Addendum)
 Thank you for coming in today.   Go to the [facility] at [facility] to get the x-rays  You should hear from MRI scheduling within 1 week. If you do not hear please let me know.

## 2024-01-15 ENCOUNTER — Ambulatory Visit
Admission: RE | Admit: 2024-01-15 | Discharge: 2024-01-15 | Disposition: A | Discharge status: Home / Self Care | Source: Ambulatory Visit | Attending: Family Medicine | Admitting: Family Medicine

## 2024-01-15 DIAGNOSIS — M7661 Achilles tendinitis, right leg: Secondary | ICD-10-CM

## 2024-01-16 ENCOUNTER — Ambulatory Visit: Payer: Self-pay | Admitting: Family Medicine

## 2024-01-16 NOTE — Progress Notes (Signed)
 Right heel x-ray does show Achilles tendinitis changes.  Again MRI will be helpful.

## 2024-01-16 NOTE — Progress Notes (Signed)
 Left heel x-ray shows Achilles tendinitis changes.  MRI will be helpful to evaluate this more thoroughly.

## 2024-01-21 ENCOUNTER — Telehealth: Payer: Self-pay | Admitting: Family Medicine

## 2024-01-21 NOTE — Telephone Encounter (Signed)
 MRI has been denied.  I have completed a peer to peer and we will have to do an appeal.  Will keep you updated.

## 2024-01-22 NOTE — Telephone Encounter (Signed)
 Please let us  know when the denial letter has been received as Dr. Joane would like to appeal.   Thanks!

## 2024-01-22 NOTE — Telephone Encounter (Signed)
 Awesome, thank you

## 2024-01-23 NOTE — Telephone Encounter (Signed)
 Pre cert approved [id number] EXP: 07/13/24   Patient scheduled for 01/29/24

## 2024-01-23 NOTE — Telephone Encounter (Signed)
 Noted, thanks Ashley!

## 2024-01-29 ENCOUNTER — Ambulatory Visit
Admission: RE | Admit: 2024-01-29 | Discharge: 2024-01-29 | Disposition: A | Discharge status: Home / Self Care | Source: Ambulatory Visit | Attending: Family Medicine | Admitting: Family Medicine

## 2024-01-29 DIAGNOSIS — M7662 Achilles tendinitis, left leg: Secondary | ICD-10-CM

## 2024-01-31 NOTE — Progress Notes (Signed)
 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  The bone insertion site of the Achilles tendon is irritated as well.

## 2024-01-31 NOTE — Progress Notes (Signed)
 Left ankle MRI shows mild Achilles tendinitis and some bony irritation.  The plantar fascia is mildly irritated as well.  This MRI does not look as bad as you feel.  Would you like a consultation with orthopedic surgery as a second opinion to double check to make sure there is no other explanation for your pain?

## 2024-02-06 ENCOUNTER — Telehealth: Payer: Self-pay | Admitting: Family Medicine

## 2024-02-06 DIAGNOSIS — M79671 Pain in right foot: Secondary | ICD-10-CM

## 2024-02-06 DIAGNOSIS — M7661 Achilles tendinitis, right leg: Secondary | ICD-10-CM

## 2024-02-06 NOTE — Addendum Note (Signed)
 Addended by: GEROME ILEANA RAMAN on: 02/06/2024 04:12 PM   Modules accepted: Orders

## 2024-02-06 NOTE — Telephone Encounter (Signed)
 Ankles--per MRI notes. Pt is now ready to see a surgeon about his ankles. He is happy to go to whomever Dr. Joane refers to, Dr. Pasco Ovens was recommended and if Dr. Joane agrees pt would like to go to him.  If not, he will go to whomever Dr. Joane recommends.

## 2024-02-10 NOTE — Telephone Encounter (Signed)
 Pt scheduled for 02/13/24 with Podiatry.

## 2024-02-13 ENCOUNTER — Encounter: Payer: Self-pay | Admitting: Podiatry

## 2024-02-13 ENCOUNTER — Ambulatory Visit

## 2024-02-13 ENCOUNTER — Ambulatory Visit: Admitting: Podiatry

## 2024-02-13 DIAGNOSIS — M7662 Achilles tendinitis, left leg: Secondary | ICD-10-CM

## 2024-02-13 DIAGNOSIS — M7661 Achilles tendinitis, right leg: Secondary | ICD-10-CM | POA: Diagnosis not present

## 2024-02-13 DIAGNOSIS — M722 Plantar fascial fibromatosis: Secondary | ICD-10-CM

## 2024-02-13 MED ORDER — PREDNISONE 10 MG PO TABS: ORAL_TABLET | ORAL | 0 refills | Status: AC

## 2024-02-13 MED ORDER — TRIAMCINOLONE ACETONIDE 10 MG/ML IJ SUSP
10.0000 mg | Freq: Once | INTRAMUSCULAR | Status: AC
  Administered 2024-02-13: 10 mg via INTRA_ARTICULAR

## 2024-02-14 NOTE — Progress Notes (Signed)
 Subjective:   Patient ID: Joe Alexander, male   DOB: 43 y.o.   MRN: [id number]   HPI Patient presents with chronic pain in his Achilles tendon bilateral with the patient having seen a sports medicine doctor and has had shockwave and home physical therapy with intensification of pain over that time.  Patient does not currently smoke likes to be active   Review of Systems  All other systems reviewed and are negative.       Objective:  Physical Exam Vitals and nursing note reviewed.  Constitutional:      Appearance: He is well-developed.  Pulmonary:     Effort: Pulmonary effort is normal.  Musculoskeletal:        General: Normal range of motion.  Skin:    General: Skin is warm.  Neurological:     Mental Status: He is alert.     Neurovascular status was found to be intact muscle strength was found to be adequate range of motion adequate with discomfort of a significant nature left posterior heel lateral side over right with mild equinus condition and dry skin on his heels.  No other significant pathology was noted and MRI indicated moderate tendinosis with no other pathology     Assessment:  Difficult case as it appears to be an acute Achilles tendinitis not severely tender but has been very bothersome at different times over the last year     Plan:  H&P reviewed at great length.  At this point x-rays do indicate spur formation posterior heel bilateral with moderate depression of the arch and no other pathology.  At this time I went ahead, to focus on the left which is more intense and I discussed injection and explained risk to him and did a sterile prep and carefully injected the lateral side 3 mg dexamethasone Kenalog 5 mg Xylocaine keep it away from the center at medial portion of the tendon.  I then applied air fracture walker to completely immobilize the lower leg that was placed on and explained and I want him to wear it as much as possible for the next 3 weeks and he  will be seen back at that time for reevaluation.  Did dispense heel pads also and placed on a Sterapred DS 12-day Dosepak to try to continue to reduce inflammation  X-rays indicate posterior spur formation bilateral moderate cavus foot structure

## 2024-03-05 ENCOUNTER — Encounter: Payer: Self-pay | Admitting: Podiatry

## 2024-03-05 ENCOUNTER — Ambulatory Visit: Admitting: Podiatry

## 2024-03-05 DIAGNOSIS — M7662 Achilles tendinitis, left leg: Secondary | ICD-10-CM | POA: Diagnosis not present

## 2024-03-05 DIAGNOSIS — M722 Plantar fascial fibromatosis: Secondary | ICD-10-CM

## 2024-03-05 DIAGNOSIS — M7661 Achilles tendinitis, right leg: Secondary | ICD-10-CM

## 2024-03-05 NOTE — Progress Notes (Signed)
 Subjective:   Patient ID: Joe Alexander, male   DOB: 43 y.o.   MRN: [id number]   HPI Patient states he is doing much better and he is very happy with the reduction of his pain from chronic Achilles tendinitis left over right of the year duration   ROS      Objective:  Physical Exam  Neurovascular status intact significant diminishment of discomfort in the posterior heel left over right inflammation still noted upon deep palpation but improved with patient walking with a better heel-toe gait pattern     Assessment:  Achilles tendinitis bilateral of long-term duration that is quite improved over where it was previously with also history of moderate fascial inflammation     Plan:  H&P reviewed and I discussed stretching exercises anti-inflammatories as needed boot immobilization as needed and ice therapy.  I then went ahead today and I casted for functional orthotics to support the arch and added consistent 1/4 inch heel lift bilateral to take pressure off the plantar arch.  And to reduce stress on the Achilles tendon I then discussed again the stretching exercises I want him to do and his return to activity

## 2024-03-31 ENCOUNTER — Telehealth: Payer: Self-pay

## 2024-03-31 NOTE — Telephone Encounter (Signed)
 Called patient to schedule an appointment to [facility]. Unable to connect left a vm to call us  back and schedule the appointment.

## 2024-04-13 ENCOUNTER — Other Ambulatory Visit: Payer: Self-pay | Admitting: Podiatry

## 2024-04-13 NOTE — Telephone Encounter (Signed)
 Do not like to repeat steroid so quickly

## 2024-04-14 ENCOUNTER — Ambulatory Visit

## 2024-04-14 DIAGNOSIS — M7661 Achilles tendinitis, right leg: Secondary | ICD-10-CM

## 2024-04-14 DIAGNOSIS — M7662 Achilles tendinitis, left leg: Secondary | ICD-10-CM

## 2024-04-14 DIAGNOSIS — M722 Plantar fascial fibromatosis: Secondary | ICD-10-CM

## 2024-04-14 NOTE — Progress Notes (Signed)

## 2024-04-22 NOTE — Telephone Encounter (Signed)
Do not refill at this time
